# Patient Record
Sex: Female | Born: 1970 | Race: White | Hispanic: No | Smoking: Current every day smoker
Health system: Southern US, Community
[De-identification: ages and names within clinical notes are randomized; demographics above are authoritative.]

## PROBLEM LIST (undated history)

## (undated) DIAGNOSIS — F419 Anxiety disorder, unspecified: Secondary | ICD-10-CM

## (undated) DIAGNOSIS — M1990 Unspecified osteoarthritis, unspecified site: Secondary | ICD-10-CM

## (undated) DIAGNOSIS — I10 Essential (primary) hypertension: Secondary | ICD-10-CM

## (undated) DIAGNOSIS — F32A Depression, unspecified: Secondary | ICD-10-CM

## (undated) DIAGNOSIS — J45909 Unspecified asthma, uncomplicated: Secondary | ICD-10-CM

## (undated) DIAGNOSIS — D649 Anemia, unspecified: Secondary | ICD-10-CM

## (undated) HISTORY — PX: CERVICAL DISCECTOMY: SHX98

---

## 2001-12-03 ENCOUNTER — Emergency Department (HOSPITAL_COMMUNITY): Admission: EM | Admit: 2001-12-03 | Discharge: 2001-12-03 | Payer: Self-pay | Admitting: Emergency Medicine

## 2003-09-21 ENCOUNTER — Emergency Department (HOSPITAL_COMMUNITY): Admission: EM | Admit: 2003-09-21 | Discharge: 2003-09-21 | Payer: Self-pay | Admitting: Emergency Medicine

## 2005-05-05 ENCOUNTER — Ambulatory Visit (HOSPITAL_COMMUNITY): Admission: RE | Admit: 2005-05-05 | Discharge: 2005-05-05 | Payer: Self-pay | Admitting: Neurological Surgery

## 2005-07-21 ENCOUNTER — Encounter: Admission: RE | Admit: 2005-07-21 | Discharge: 2005-07-21 | Payer: Self-pay | Admitting: Neurological Surgery

## 2016-08-06 ENCOUNTER — Emergency Department (HOSPITAL_COMMUNITY)
Admission: EM | Admit: 2016-08-06 | Discharge: 2016-08-06 | Disposition: A | Discharge status: Home / Self Care | Payer: Medicaid Other | Attending: Emergency Medicine | Admitting: Emergency Medicine

## 2016-08-06 ENCOUNTER — Encounter (HOSPITAL_COMMUNITY): Payer: Self-pay

## 2016-08-06 DIAGNOSIS — J45909 Unspecified asthma, uncomplicated: Secondary | ICD-10-CM | POA: Insufficient documentation

## 2016-08-06 DIAGNOSIS — F17210 Nicotine dependence, cigarettes, uncomplicated: Secondary | ICD-10-CM | POA: Insufficient documentation

## 2016-08-06 DIAGNOSIS — J209 Acute bronchitis, unspecified: Secondary | ICD-10-CM

## 2016-08-06 DIAGNOSIS — R05 Cough: Secondary | ICD-10-CM | POA: Diagnosis present

## 2016-08-06 DIAGNOSIS — Z720 Tobacco use: Secondary | ICD-10-CM

## 2016-08-06 HISTORY — DX: Unspecified asthma, uncomplicated: J45.909

## 2016-08-06 MED ORDER — BENZONATATE 100 MG PO CAPS
200.0000 mg | ORAL_CAPSULE | Freq: Once | ORAL | Status: AC
  Administered 2016-08-06: 200 mg via ORAL
  Filled 2016-08-06: qty 2

## 2016-08-06 MED ORDER — AZITHROMYCIN 250 MG PO TABS: ORAL_TABLET | ORAL | 0 refills | Status: DC

## 2016-08-06 MED ORDER — BENZONATATE 100 MG PO CAPS: 200.0000 mg | ORAL_CAPSULE | Freq: Three times a day (TID) | ORAL | 0 refills | Status: DC | PRN

## 2016-08-06 NOTE — ED Notes (Signed)
Pt alert & oriented x4, stable gait. Patient given discharge instructions, paperwork & prescription(s). Patient  instructed to stop at the registration desk to finish any additional paperwork. Patient verbalized understanding. Pt left department w/ no further questions. 

## 2016-08-06 NOTE — ED Triage Notes (Signed)
Cough, congestion X1 week. Weakness, headache with nausea and vomiting. Patient drinking soda in triage without emesis

## 2016-08-06 NOTE — ED Notes (Signed)
Pt states cough for the past 3 days. Pt says had a low grade fever at 100 point something. Pt taking musinex w/o relief.

## 2016-08-06 NOTE — Discharge Instructions (Signed)
Get plenty of rest and drink a lot of fluids.  Try to stop smoking.

## 2016-08-06 NOTE — ED Provider Notes (Signed)
[facility] Provider Note   CSN: [id number] Arrival date & time: 08/06/16  1910     History   Chief Complaint Chief Complaint  Patient presents with  . Cough  . Nasal Congestion    HPI Natalie Acevedo is a 46 y.o. female.  Resents for evaluation of nonproductive cough with low-grade fever, unresponsive to multiple over-the-counter medications.  She denies nausea, vomiting, weakness or dizziness.  She is a smoker.  Similar problem 3 months ago when she was treated with a Z-Pak for a "upper respiratory infection".  She is a smoker.  There are no other known modifying factors.  HPI  Past Medical History:  Diagnosis Date  . Asthma     There are no active problems to display for this patient.   Past Surgical History:  Procedure Laterality Date  . CERVICAL DISCECTOMY    . CESAREAN SECTION     X4    OB History    No data available       Home Medications    Prior to Admission medications   Medication Sig Start Date End Date Taking? Authorizing Provider  azithromycin (ZITHROMAX Z-PAK) 250 MG tablet 2 po day one, then 1 daily x 4 days 08/06/16   Mancel Bale, MD  benzonatate (TESSALON) 100 MG capsule Take 2 capsules (200 mg total) by mouth 3 (three) times daily as needed for cough. 08/06/16   Mancel Bale, MD    Family History No family history on file.  Social History Social History  Substance Use Topics  . Smoking status: Current Every Day Smoker    Packs/day: 1.00    Types: Cigarettes  . Smokeless tobacco: Never Used  . Alcohol use Yes     Comment: occ.     Allergies   Patient has no known allergies.   Review of Systems Review of Systems  All other systems reviewed and are negative.    Physical Exam Updated Vital Signs BP (!) 149/92 (BP Location: Right Arm)   Pulse 95   Temp 98.9 F (37.2 C) (Oral)   Resp 18   Ht 5' 2.5" (1.588 m)   Wt 210 lb (95.3 kg)   LMP 07/06/2016   SpO2 97%   BMI 37.80 kg/m   Physical Exam    Constitutional: She is oriented to person, place, and time. She appears well-developed and well-nourished. No distress.  HENT:  Head: Normocephalic and atraumatic.  Eyes: Conjunctivae and EOM are normal. Pupils are equal, round, and reactive to light.  Neck: Normal range of motion and phonation normal. Neck supple.  Cardiovascular: Normal rate and regular rhythm.   Pulmonary/Chest: Effort normal and breath sounds normal. No respiratory distress. She has no wheezes. She has no rales. She exhibits no tenderness.  Occasional fits of cough, which resolve after 10 seconds.  Lungs with good air movement bilaterally.  No increased work of breathing.  Abdominal: Soft. She exhibits no distension. There is no tenderness. There is no guarding.  Musculoskeletal: Normal range of motion.  Neurological: She is alert and oriented to person, place, and time. She exhibits normal muscle tone.  Skin: Skin is warm and dry.  Psychiatric: She has a normal mood and affect. Her behavior is normal. Judgment and thought content normal.  Nursing note and vitals reviewed.    ED Treatments / Results  Labs (all labs ordered are listed, but only abnormal results are displayed) Labs Reviewed - No data to display  EKG  EKG Interpretation None  Radiology No results found.  Procedures Procedures (including critical care time)  Medications Ordered in ED Medications  benzonatate (TESSALON) capsule 200 mg (200 mg Oral Given 08/06/16 2013)     Initial Impression / Assessment and Plan / ED Course  I have reviewed the triage vital signs and the nursing notes.  Pertinent labs & imaging results that were available during my care of the patient were reviewed by me and considered in my medical decision making (see chart for details).     Medications  benzonatate (TESSALON) capsule 200 mg (200 mg Oral Given 08/06/16 2013)    Patient Vitals for the past 24 hrs:  BP Temp Temp src Pulse Resp SpO2 Height  Weight  08/06/16 1920 - - - - - - 5' 2.5" (1.588 m) 210 lb (95.3 kg)  08/06/16 1919 (!) 149/92 98.9 F (37.2 C) Oral 95 18 97 % - -    9:05 PM Reevaluation with update and discussion. After initial assessment and treatment, an updated evaluation reveals she states her cough is less now, and she is "ready to go."  Findings discussed with patient and all questions answered. [name]    Final Clinical Impressions(s) / ED Diagnoses   Final diagnoses:  Acute bronchitis, unspecified organism  Tobacco abuse   Evaluation is consistent with bronchitis related to tobacco abuse, with irritant cough.  She requested Z-Pak and it is given.  Doubt serious bacterial infection, pneumonia or impending vascular collapse.    Nursing Notes Reviewed/ Care Coordinated Applicable Imaging Reviewed Interpretation of Laboratory Data incorporated into ED treatment  The patient appears reasonably screened and/or stabilized for discharge and I doubt any other medical condition or other [facility] requiring further screening, evaluation, or treatment in the ED at this time prior to discharge.  Plan: Home Medications-continue usual; Home Treatments-rest, fluids, stop smoking; return here if the recommended treatment, does not improve the symptoms; Recommended follow up-PCP, as needed  New Prescriptions New Prescriptions   AZITHROMYCIN (ZITHROMAX Z-PAK) 250 MG TABLET    2 po day one, then 1 daily x 4 days   BENZONATATE (TESSALON) 100 MG CAPSULE    Take 2 capsules (200 mg total) by mouth 3 (three) times daily as needed for cough.     Mancel Bale, MD 08/06/16 2108

## 2016-09-09 ENCOUNTER — Encounter: Payer: Self-pay | Admitting: Family

## 2016-09-09 ENCOUNTER — Encounter (HOSPITAL_COMMUNITY): Payer: Self-pay | Admitting: Emergency Medicine

## 2016-09-09 ENCOUNTER — Ambulatory Visit (INDEPENDENT_AMBULATORY_CARE_PROVIDER_SITE_OTHER): Payer: Worker's Compensation | Admitting: Family

## 2016-09-09 ENCOUNTER — Ambulatory Visit (INDEPENDENT_AMBULATORY_CARE_PROVIDER_SITE_OTHER): Payer: Worker's Compensation

## 2016-09-09 ENCOUNTER — Emergency Department (HOSPITAL_COMMUNITY)
Admission: EM | Admit: 2016-09-09 | Discharge: 2016-09-09 | Disposition: A | Discharge status: Home / Self Care | Payer: Worker's Compensation | Attending: Emergency Medicine | Admitting: Emergency Medicine

## 2016-09-09 ENCOUNTER — Emergency Department (HOSPITAL_COMMUNITY): Payer: Worker's Compensation

## 2016-09-09 VITALS — BP 139/88 | HR 65 | Temp 97.2°F | Ht 62.5 in | Wt 200.2 lb

## 2016-09-09 DIAGNOSIS — IMO0001 Reserved for inherently not codable concepts without codable children: Secondary | ICD-10-CM

## 2016-09-09 DIAGNOSIS — Z23 Encounter for immunization: Secondary | ICD-10-CM | POA: Insufficient documentation

## 2016-09-09 DIAGNOSIS — Y929 Unspecified place or not applicable: Secondary | ICD-10-CM | POA: Diagnosis not present

## 2016-09-09 DIAGNOSIS — Y990 Civilian activity done for income or pay: Secondary | ICD-10-CM | POA: Insufficient documentation

## 2016-09-09 DIAGNOSIS — S6991XA Unspecified injury of right wrist, hand and finger(s), initial encounter: Secondary | ICD-10-CM

## 2016-09-09 DIAGNOSIS — W268XXA Contact with other sharp object(s), not elsewhere classified, initial encounter: Secondary | ICD-10-CM | POA: Diagnosis not present

## 2016-09-09 DIAGNOSIS — S62306B Unspecified fracture of fifth metacarpal bone, right hand, initial encounter for open fracture: Secondary | ICD-10-CM

## 2016-09-09 DIAGNOSIS — Y9389 Activity, other specified: Secondary | ICD-10-CM | POA: Insufficient documentation

## 2016-09-09 DIAGNOSIS — S62636B Displaced fracture of distal phalanx of right little finger, initial encounter for open fracture: Secondary | ICD-10-CM

## 2016-09-09 DIAGNOSIS — S6710XA Crushing injury of unspecified finger(s), initial encounter: Secondary | ICD-10-CM | POA: Diagnosis not present

## 2016-09-09 MED ORDER — AMOXICILLIN-POT CLAVULANATE 875-125 MG PO TABS: 1.0000 | ORAL_TABLET | Freq: Two times a day (BID) | ORAL | 0 refills | Status: DC

## 2016-09-09 MED ORDER — ONDANSETRON HCL 4 MG PO TABS
4.0000 mg | ORAL_TABLET | Freq: Once | ORAL | Status: AC
  Administered 2016-09-09: 4 mg via ORAL
  Filled 2016-09-09: qty 1

## 2016-09-09 MED ORDER — AMOXICILLIN-POT CLAVULANATE 875-125 MG PO TABS
1.0000 | ORAL_TABLET | Freq: Once | ORAL | Status: AC
  Administered 2016-09-09: 1 via ORAL
  Filled 2016-09-09: qty 1

## 2016-09-09 MED ORDER — LIDOCAINE HCL (PF) 2 % IJ SOLN
INTRAMUSCULAR | Status: AC
Start: 2016-09-09 — End: 2016-09-09
  Administered 2016-09-09: 6 mL
  Filled 2016-09-09: qty 10

## 2016-09-09 MED ORDER — LIDOCAINE HCL (PF) 2 % IJ SOLN
10.0000 mL | Freq: Once | INTRAMUSCULAR | Status: AC
  Administered 2016-09-09: 6 mL

## 2016-09-09 MED ORDER — OXYCODONE-ACETAMINOPHEN 5-325 MG PO TABS: 1.0000 | ORAL_TABLET | Freq: Four times a day (QID) | ORAL | 0 refills | Status: DC | PRN

## 2016-09-09 MED ORDER — TETANUS-DIPHTH-ACELL PERTUSSIS 5-2.5-18.5 LF-MCG/0.5 IM SUSP
0.5000 mL | Freq: Once | INTRAMUSCULAR | Status: AC
  Administered 2016-09-09: 0.5 mL via INTRAMUSCULAR
  Filled 2016-09-09: qty 0.5

## 2016-09-09 NOTE — Discharge Instructions (Signed)
You have an open fracture of your right little finger. Please keep your hand elevated above your heart is much as possible. Keep the wound clean and dry. Use Tylenol or ibuprofen for mild pain, use Percocet for more severe pain. Percocet may cause drowsiness, and/or constipation. Please use this medication with caution. Please use Augmentin 2 times daily with a meal. Call Dr. Romeo AppleHarrison for an appointment concerning your hand as soon as possible.

## 2016-09-09 NOTE — ED Provider Notes (Signed)
[facility] Provider Note   CSN: [id number] Arrival date & time: 09/09/16  1331     History   Chief Complaint Chief Complaint  Patient presents with  . Laceration    HPI Natalie Acevedo is a 46 y.o. female.  Patient is a 46 year old female who presents to the emergency department with the complaint of laceration to the right little finger.  The patient states that while at work a palate ran over her finger. She sustained a laceration to the tip of the finger. She presented to the [facility]. They evaluated her, x-ray her and advised her to come to the emergency department for additional evaluation and management. The patient is unsure of the date of her last tetanus. She has not been on any anticoagulation medications recently. There's been no previous operations or procedures involving her right hand on. No other injuries have been reported other than the little finger.   The history is provided by the patient.  Laceration      Past Medical History:  Diagnosis Date  . Asthma     There are no active problems to display for this patient.   Past Surgical History:  Procedure Laterality Date  . CERVICAL DISCECTOMY    . CESAREAN SECTION     X4    OB History    No data available       Home Medications    Prior to Admission medications   Medication Sig Start Date End Date Taking? Authorizing Provider  amoxicillin-clavulanate (AUGMENTIN) 875-125 MG tablet Take 1 tablet by mouth every 12 (twelve) hours. 09/09/16   Ivery QualeBryant, Novalynn Branaman, PA-C  gabapentin (NEURONTIN) 800 MG tablet  07/11/16   [provider]  HYDROcodone-acetaminophen [facility](NORCO) 7.5-325 MG tablet  07/11/16   [provider]  meloxicam (MOBIC) 15 MG tablet  07/11/16   [provider]  oxyCODONE-acetaminophen (PERCOCET/ROXICET) 5-325 MG tablet Take 1 tablet by mouth every 6 (six) hours as needed. 09/09/16   Ivery QualeBryant, Donovin Kraemer, PA-C  tiZANidine (ZANAFLEX) 4  MG tablet  07/11/16   [provider]    Family History History reviewed. No pertinent family history.  Social History Social History  Substance Use Topics  . Smoking status: Current Every Day Smoker    Packs/day: 1.00    Types: Cigarettes  . Smokeless tobacco: Never Used  . Alcohol use Yes     Comment: occ.     Allergies   Patient has no known allergies.   Review of Systems Review of Systems  Constitutional: Negative for activity change.       All ROS Neg except as noted in HPI  HENT: Negative for nosebleeds.   Eyes: Negative for photophobia and discharge.  Respiratory: Negative for cough, shortness of breath and wheezing.   Cardiovascular: Negative for chest pain and palpitations.  Gastrointestinal: Negative for abdominal pain and blood in stool.  Genitourinary: Negative for dysuria, frequency and hematuria.  Musculoskeletal: Negative for arthralgias, back pain and neck pain.  Skin: Negative.   Neurological: Negative for dizziness, seizures and speech difficulty.  Psychiatric/Behavioral: Negative for confusion and hallucinations.     Physical Exam Updated Vital Signs BP (!) 163/86 (BP Location: Left Arm)   Pulse 73   Temp 97.7 F (36.5 C) (Oral)   Resp 18   LMP 08/06/2016   SpO2 95%   Physical Exam  Constitutional: She is oriented to person, place, and time. She appears well-developed and well-nourished.  Non-toxic appearance.  HENT:  Head: Normocephalic.  Right Ear: Tympanic membrane and external ear normal.  Left Ear: Tympanic membrane and external ear normal.  Eyes: EOM and lids are normal. Pupils are equal, round, and reactive to light.  Neck: Normal range of motion. Neck supple. Carotid bruit is not present.  Cardiovascular: Normal rate, regular rhythm, normal heart sounds, intact distal pulses and normal pulses.   Pulmonary/Chest: Breath sounds normal. No respiratory distress.  Abdominal: Soft. Bowel sounds are normal. There is no tenderness.  There is no guarding.  Musculoskeletal: She exhibits tenderness and deformity.  There is full range of motion of the right shoulder, elbow, and wrists. There is full range of motion of the fingers of the right hand. There is avulsion and laceration of the distal portion of the little finger. There is exposed bone at the distal tip. There is a laceration to the medial and lateral aspect of the finger. The nail is almost completely avulsed.  Lymphadenopathy:       Head (right side): No submandibular adenopathy present.       Head (left side): No submandibular adenopathy present.    She has no cervical adenopathy.  Neurological: She is alert and oriented to person, place, and time. She has normal strength. No cranial nerve deficit or sensory deficit.  There no gross motor or sensory deficits of the upper extremities.  Skin: Skin is warm and dry.  Psychiatric: She has a normal mood and affect. Her speech is normal.  Nursing note and vitals reviewed.    ED Treatments / Results  Labs (all labs ordered are listed, but only abnormal results are displayed) Labs Reviewed - No data to display  EKG  EKG Interpretation None       Radiology Dg Finger Little Right  Result Date: 09/09/2016 CLINICAL DATA:  Crush injury EXAM: RIGHT LITTLE FINGER 2+V COMPARISON:  None. FINDINGS: Comminuted fracture of the tuft of the fifth distal phalanx. Mild displacement of the tuft. Dorsal soft tissue injury. IMPRESSION: Comminuted and displaced fracture of the tuft of the distal fifth phalanx. Electronically Signed   By: Marlan Palau M.D.   On: 09/09/2016 13:30    Procedures .Marland KitchenLaceration Repair Date/Time: 09/09/2016 3:59 PM Performed by: Ivery Quale Authorized by: Ivery Quale   Consent:    Consent obtained:  Verbal   Consent given by:  Patient   Risks discussed:  Infection, pain and poor cosmetic result Anesthesia (see MAR for exact dosages):    Anesthesia method:  Nerve block   Block needle  gauge:  25 G   Block anesthetic:  Lidocaine 2% w/o epi Laceration details:    Location:  Finger   Finger location:  R small finger   Length (cm):  2.7 Repair type:    Repair type:  Complex Pre-procedure details:    Preparation:  Patient was prepped and draped in usual sterile fashion Exploration:    Hemostasis achieved with:  Direct pressure   Wound exploration: entire depth of wound probed and visualized     Wound extent: underlying fracture     Wound extent: no nerve damage noted and no tendon damage noted   Treatment:    Area cleansed with:  Betadine and saline   Amount of cleaning:  Standard   Irrigation solution:  Sterile saline Skin repair:    Repair method:  Sutures   Suture size:  4-0   Suture material:  Nylon   Suture technique:  Simple interrupted   Number of sutures:  6 Post-procedure details:  Dressing:  Non-adherent dressing and splint for protection   Patient tolerance of procedure:  Tolerated well, no immediate complications  .Splint Application Date/Time: 09/10/2016 8:58 AM Performed by: Ivery Quale Authorized by: Ivery Quale   Consent:    Consent obtained:  Verbal   Consent given by:  Patient   Risks discussed:  Pain Universal protocol:    Procedure explained and questions answered to patient or proxy's satisfaction: yes     Imaging studies available: yes (Comminuted fracture distal right little finger)     Immediately prior to procedure a time out was called: yes     Patient identity confirmed:  Arm band Pre-procedure details:    Sensation:  Normal Procedure details:    Laterality:  Right   Location:  Finger   Finger:  R small finger   Splint type:  Finger   Supplies:  Aluminum splint Post-procedure details:    Pain:  Unchanged   Sensation:  Normal   Skin color:  Normal   Patient tolerance of procedure:  Tolerated well, no immediate complications   (including critical care time)  Medications Ordered in ED Medications  lidocaine  (XYLOCAINE) 2 % injection 10 mL (6 mLs Other Given by Other 09/09/16 1505)  amoxicillin-clavulanate (AUGMENTIN) 875-125 MG per tablet 1 tablet (1 tablet Oral Given 09/09/16 1557)  ondansetron (ZOFRAN) tablet 4 mg (4 mg Oral Given 09/09/16 1557)  Tdap (BOOSTRIX) injection 0.5 mL (0.5 mLs Intramuscular Given 09/09/16 1557)     Initial Impression / Assessment and Plan / ED Course  I have reviewed the triage vital signs and the nursing notes.  Pertinent labs & imaging results that were available during my care of the patient were reviewed by me and considered in my medical decision making (see chart for details).       Final Clinical Impressions(s) / ED Diagnoses MDM Patient sustained injury to the right little finger when a pallet ran over her finger. She has avulsion of tissue, avulsion of nail, comminuted fracture, open fracture. The lacerations were repaired with sutures. A finger splint was placed. The patient was provided with ice pack. She's also provided with antibiotics since she has an open fracture. Pain medication was offered, but the patient states she has pain medication at home. Patient's tetanus status was updated. She will see Dr. Romeo Apple for orthopedic management at this point.    Final diagnoses:  Cut  Open displaced fracture of distal phalanx of right little finger, initial encounter    New Prescriptions New Prescriptions   AMOXICILLIN-CLAVULANATE (AUGMENTIN) 875-125 MG TABLET    Take 1 tablet by mouth every 12 (twelve) hours.   OXYCODONE-ACETAMINOPHEN (PERCOCET/ROXICET) 5-325 MG TABLET    Take 1 tablet by mouth every 6 (six) hours as needed.     Ivery Quale, PA-C 09/10/16 1610    Blane Ohara, MD 09/12/16 [phone]

## 2016-09-09 NOTE — Patient Instructions (Addendum)
Crush Injury of the Hand  When a crush injury of the hand occurs, many structures within the hand and wrist joint can be affected. This can result in a complicated injury that may involve:   One or more broken (fractured) bones.   Lacerations or abrasions of the skin. These increase your risk of infection.   Compressed or torn muscles.   Torn ligaments and tendons.   Broken blood vessels, causing bleeding within the tissues. This can lead to dangerously high pressure within the tissues (compartment syndrome).   Damage to nerves.   One or more finger amputations.    What are the causes?  This type of injury can happen when a great amount of force is suddenly applied to the hand. This might occur:   During a motor vehicle accident.   If the hand is pulled into a machine during industrial or agricultural work.   If a heavy load falls directly onto the hand.    What are the signs or symptoms?  Symptoms will vary depending on which structures in your hand have been injured. Symptoms may include:   Moderate or severe pain in the hand, wrist, or arm.   Bleeding at the site of injury.   Tingling, numbness, or loss of feeling (sensation) in part or all of your hand.   Loss of movement in part or all of your hand.    How is this diagnosed?  Your health care provider will examine you and ask questions about how your injury happened. The exam may include checking for sensation and blood flow into your hand. You may also have tests, including X-rays and procedures to check the pressure in your hand.  After initial treatment, additional studies may be done to further diagnose the extent of your injuries. These may include:   A nerve conduction study to determine how well the nerves are working in your arm and hand.   MRI to determine if other injuries occurred that do not usually show up on X-ray.    How is this treated?  Treatment for this condition depends on the severity of your crush injury. Treatment may  include:   Thorough cleaning if you have an open wound. This may or may not require surgery.   Having a splint applied to your fingers, hand, or forearm.   Medicine to relieve pain.   Antibiotic medicine to prevent infection.   Stitches (sutures) to close open wounds.   One or more surgeries to address injuries to skin, bones, joints, tendons, ligaments, muscles, nerves, or blood vessels.    Follow these instructions at home:  If you have a splint:    Wear the splint as told by your health care provider. Remove it only as told by your health care provider.   Loosen the splint if your fingers tingle, become numb, or turn cold and blue.   Do not let your splint get wet if it is not waterproof.   Keep the splint clean.  Wound care      If you have any skin wounds that were covered with bandages (dressings), follow instructions from your health care provider about how to take care of your wound. Make sure you:  ? Wash your hands with soap and water before you change your dressing. If soap and water are not available, use hand sanitizer.  ? Change your dressing as told by your health care provider.  ? Leave stitches (sutures), skin glue, or adhesive strips in place.   These skin closures may need to stay in place for 2 weeks or longer. If adhesive strip edges start to loosen and curl up, you may trim the loose edges. Do not remove adhesive strips completely unless your health care provider tells you to do that.   If you have skin wounds, check them every day for signs of infection. Check for:  ? More redness, swelling, or pain.  ? More fluid or blood.  ? Warmth.  ? Pus or a bad smell.  Managing pain, stiffness, and swelling    If directed, put ice on the injured area.  ? Put ice in a plastic bag.  ? Place a towel between your skin and the bag.  ? Leave the ice on for 20 minutes, 2-3 times a day.   Raise (elevate) the injured area above the level of your heart while you are sitting or lying down.  Driving     Do not drive or operate heavy machinery while taking prescription pain medicine.   Ask your health care provider when it is safe to drive if you have a splint on your hand or arm.  Activity    Return to your normal activities as told by your health care provider. Ask your health care provider what activities are safe for you.   Work with a physical therapist (PT) or occupational therapist (OT) as told by your health care provider.  General instructions    Do not put pressure on any part of the splint until it is fully hardened. This may take several hours.   If you have a splint and it is not waterproof, cover it with a watertight plastic bag when you take a bath or a shower.   Take over-the-counter and prescription medicines only as told by your health care provider.   If you were prescribed an antibiotic, take it as told by your health care provider. Do not stop taking the antibiotic before the prescription is done.   Do not use any tobacco products, such as cigarettes, chewing tobacco, and e-cigarettes. Tobacco can delay bone healing. If you need help quitting, ask your health care provider.   Keep all follow-up visits as told by your health care provider. This is important. These include PT and OT visits.  Contact a health care provider if:   A wound that was sutured opens up.   You have more redness, swelling, or pain in your hand.   You have more fluid or blood coming from your hand.   Your hand feels warm to the touch.   You have pus or a bad smell coming from your hand.   You have a fever.  Get help right away if:   You suddenly develop severe pain in your hand.   You previously had sensation in your hand and you suddenly lose sensation.   Your wrist or hand becomes bent (contracted) involuntarily.   Your symptoms had improved and they suddenly get worse.   Your hand or fingers are turning pink or blue.  This information is not intended to replace advice given to you by your health  care provider. Make sure you discuss any questions you have with your health care provider.  Document Released: 03/23/2015 Document Revised: 09/17/2015 Document Reviewed: 12/03/2014  [employer] Interactive Patient Education  2017 [employer]

## 2016-09-09 NOTE — ED Triage Notes (Signed)
Pt cut right pinky finger at work. Half of tip of finger handing. Bleeding controlled. Nail bed not present. Pt denies pain

## 2016-09-09 NOTE — ED Notes (Addendum)
Pt advised us that she was sent by [facility] and had xrays there . Pa aware and hold another xray at this time.

## 2016-09-09 NOTE — Progress Notes (Signed)
   Subjective:    Patient ID: Natalie Acevedo, female    DOB: 03-25-1971, 46 y.o.   MRN: [id number]  HPI Pt presents to the office today for right hand pain that is a Workers Comp Injury at [employer]. Pt states today while working she was reaching for a cord and a pallet ran over her upper small finger. Pt states she is not having any pain at this time.    Review of Systems  Skin: Positive for wound.  All other systems reviewed and are negative.      Objective:   Physical Exam  Constitutional: She is oriented to person, place, and time. She appears well-developed and well-nourished. No distress.  Neck: No thyromegaly present.  Cardiovascular: Normal rate, regular rhythm, normal heart sounds and intact distal pulses.   No murmur heard. Pulmonary/Chest: Effort normal and breath sounds normal. No respiratory distress. She has no wheezes.  Musculoskeletal: Normal range of motion. She exhibits tenderness. She exhibits no edema.  Neurological: She is alert and oriented to person, place, and time.  Skin: Skin is warm and dry.  Crushing injury to right fifth digit to the distal phalanx, opened  Psychiatric: She has a normal mood and affect. Her behavior is normal. Judgment and thought content normal.  Vitals reviewed.       BP 139/88   Pulse 65   Temp 97.2 F (36.2 C) (Oral)   Ht 5' 2.5" (1.588 m)   Wt 200 lb 3.2 oz (90.8 kg)   BMI 36.03 kg/m      Assessment & Plan:  1. Injury of finger of right hand, initial encounter - DG Finger Little Right; Future  2. Crushing injury of finger, initial encounter  3. Open nondisplaced fracture of fifth metacarpal bone of right hand, unspecified portion of metacarpal, initial encounter    Pt told to go to ED Keep clean and dry Do not pick at wound!!!!   Jannifer Rodneyhristy Cruzita Lipa, FNP

## 2016-09-23 ENCOUNTER — Telehealth: Payer: Self-pay | Admitting: Orthopedic Surgery

## 2016-09-23 NOTE — Telephone Encounter (Signed)
We ll see when we get W/C info

## 2016-09-23 NOTE — Telephone Encounter (Signed)
ER visit 09/09/16 for Open distal fracture of right little finger has been determined as Workers Comp. We have been Workers Designer, industrial/productComp adjuster requesting appointment, please review and advise to schedule here or possible Hydrographic surveyorhand surgeon.

## 2016-09-26 NOTE — Telephone Encounter (Signed)
Contacted patient upon receiving [employer] information regarding workers comp - received claim# [id number], per [employer] Corporationadjuster Christy Wilson, [phone], fax#[phone].  Appointment has been scheduled, as all billing information has been received. Patient aware of appointment (scheduled for date patient requests, Friday, 09/30/16, 11:30a.m.)

## 2016-09-30 ENCOUNTER — Encounter: Payer: Self-pay | Admitting: Orthopedic Surgery

## 2016-09-30 ENCOUNTER — Ambulatory Visit (INDEPENDENT_AMBULATORY_CARE_PROVIDER_SITE_OTHER): Payer: Worker's Compensation | Admitting: Orthopedic Surgery

## 2016-09-30 VITALS — BP 154/94 | HR 100 | Wt 195.0 lb

## 2016-09-30 DIAGNOSIS — S62636A Displaced fracture of distal phalanx of right little finger, initial encounter for closed fracture: Secondary | ICD-10-CM

## 2016-09-30 NOTE — Progress Notes (Signed)
  NEW PATIENT OFFICE VISIT    Chief Complaint  Patient presents with  . Finger Injury    right little finger fracture S/P crush injury, DOI 09/09/16    46 year old female was at work and her finger was crushed. She went to the emergency room for evaluation of the right small finger was treated and she presents 3 weeks later for mandatory evaluation    Review of Systems  Constitutional: Negative for chills and fever.  Skin: Negative.  Negative for itching and rash.  Neurological: Negative for tingling.     Past Medical History:  Diagnosis Date  . Asthma     Past Surgical History:  Procedure Laterality Date  . CERVICAL DISCECTOMY    . CESAREAN SECTION     X4    No family history on file. Social History  Substance Use Topics  . Smoking status: Current Every Day Smoker    Packs/day: 1.00    Types: Cigarettes  . Smokeless tobacco: Never Used  . Alcohol use Yes     Comment: occ.    BP (!) 154/94   Pulse 100   Wt 195 lb (88.5 kg)   BMI 35.10 kg/m   Physical Exam  Constitutional: She is oriented to person, place, and time. She appears well-developed and well-nourished.  Neurological: She is alert and oriented to person, place, and time.  Skin: Skin is warm and dry. Capillary refill takes less than 2 seconds. No rash noted.  Psychiatric: She has a normal mood and affect. Her behavior is normal. Judgment and thought content normal.    Ortho Exam The right index finger has a scab over the pulp the nail is proximal to this area there is some motion there from the fracture. There is no instability of the joint is full range of motion normal sensation normal capillary refill and no sign of infection No orders of the defined types were placed in this encounter.   Encounter Diagnosis  Name Primary?  . Closed displaced fracture of distal phalanx of right little finger, initial encounter Yes     PLAN:   The patient can resume normal activity there is no long-term  issue that I can see  She can return to work full duty no follow-up needed

## 2016-09-30 NOTE — Patient Instructions (Signed)
Resume normal activity including full duty

## 2018-02-28 DIAGNOSIS — I10 Essential (primary) hypertension: Secondary | ICD-10-CM | POA: Insufficient documentation

## 2018-02-28 DIAGNOSIS — E669 Obesity, unspecified: Secondary | ICD-10-CM | POA: Insufficient documentation

## 2018-02-28 DIAGNOSIS — R252 Cramp and spasm: Secondary | ICD-10-CM | POA: Insufficient documentation

## 2018-10-02 ENCOUNTER — Encounter: Payer: Self-pay | Admitting: Neurology

## 2018-10-02 NOTE — Progress Notes (Signed)
Virtual Visit via Video Note The purpose of this virtual visit is to provide medical care while limiting exposure to the novel coronavirus.    Consent was obtained for video visit:  Yes Answered questions that patient had about telehealth interaction:  Yes I discussed the limitations, risks, security and privacy concerns of performing an evaluation and management service by telemedicine. I also discussed with the patient that there may be a patient responsible charge related to this service. The patient expressed understanding and agreed to proceed.  Pt location: Home Physician Location: Home Name of referring provider:  Sherryl MangesMeyran, Natalie Acevedo* I connected with Natalie Acevedo at patients initiation/request on 10/03/2018 at 12:50 PM EDT by video enabled telemedicine application and verified that I am speaking with the correct person using two identifiers. Pt MRN:  [id number] Pt DOB:  August 21, 1970 Video Participants:  Natalie Acevedo   History of Present Illness:  Natalie Acevedo is a 48 year old woman who presents for migraines.  Onset:  2 months ago.  She reports feeling menopausal.  She is feeling more depressed and irritable.  Many years ago, she had minor daily headaches which responded to acupuncture. Location:  bifrontal Quality:  throbbing Intensity:  10/10.  She denies new headache, thunderclap headache  Aura:  no Premonitory Phase:  no Postdrome:  no Associated symptoms:  Sometimes nausea..  She denies associated vomiting, photophobia, phonophobia, visual disturbance or unilateral numbness or weakness. Duration:  Varies hour to several hours Frequency:  daily Frequency of abortive medication: Excedrin Migraine daily.  Sumatriptan once in past month. Triggers:  Valsalva (coughting, sneezing) Relieving factors:  no Activity:  Aggravates. Sometimes dizziness and staggering.  Some memory issues.  Current NSAIDS:  Mobic Current analgesics:  Excedrin Migraine Current triptans:   Sumatriptan 50mg  Current muscle relaxants:  Tizanidine 4mg  Current Antidepressant medications:  Lexapro 10mg  daily  Past anticonvulsant:  topiramate 50mg    Smoker:  1  ppd Depression:  yes; Anxiety:  yes.  She says she is menopausal.  She is more irritable.  Endorses mood swings Family history of headache:  no  Past Medical History: Past Medical History:  Diagnosis Date  . Asthma     Medications: Outpatient Encounter Medications as of 10/03/2018  Medication Sig  . gabapentin (NEURONTIN) 800 MG tablet   . HYDROcodone-acetaminophen (NORCO) 7.5-325 MG tablet   . meloxicam (MOBIC) 15 MG tablet   . tiZANidine (ZANAFLEX) 4 MG tablet    No facility-administered encounter medications on file as of 10/03/2018.     Allergies: No Known Allergies  Family History: No family history on file.  Social History: Social History   Socioeconomic History  . Marital status: Divorced    Spouse name: Not on file  . Number of children: Not on file  . Years of education: Not on file  . Highest education level: Not on file  Occupational History  . Not on file  Social Needs  . Financial resource strain: Not on file  . Food insecurity:    Worry: Not on file    Inability: Not on file  . Transportation needs:    Medical: Not on file    Non-medical: Not on file  Tobacco Use  . Smoking status: Current Every Day Smoker    Packs/day: 1.00    Types: Cigarettes  . Smokeless tobacco: Never Used  Substance and Sexual Activity  . Alcohol use: Yes    Comment: occ.  . Drug use: No  . Sexual activity: Not on  file  Lifestyle  . Physical activity:    Days per week: Not on file    Minutes per session: Not on file  . Stress: Not on file  Relationships  . Social connections:    Talks on phone: Not on file    Gets together: Not on file    Attends religious service: Not on file    Active member of club or organization: Not on file    Attends meetings of clubs or organizations: Not on file     Relationship status: Not on file  . Intimate partner violence:    Fear of current or ex partner: Not on file    Emotionally abused: Not on file    Physically abused: Not on file    Forced sexual activity: Not on file  Other Topics Concern  . Not on file  Social History Narrative  . Not on file    Observations/Objective:   Vitals:  no acute distress.  Alert and oriented.  Speech fluent.  Face symmetric.  Assessment and Plan:   New-onset daily persistent headaches.  Possible intractable migraine without aura Tobacco use disorder  1. As these are new-onset persistent headaches, will check MRI of brain with and without contrast  2. For preventative management, restart topiramate 50mg  at bedtime and increase to 100mg  at bedtime in one week.  If ineffective, try Aimovig 3.  For abortive therapy, stop sumatriptan and instead try rizatriptan 10mg  4.  Limit use of pain relievers to no more than 2 days out of week to prevent risk of rebound or medication-overuse headache. 5.  Keep headache diary 6.  Exercise, hydration, caffeine cessation, sleep hygiene, monitor for and avoid triggers 7.  Consider:  magnesium citrate 400mg  daily, riboflavin 400mg  daily, and coenzyme Q10 100mg  three times daily 8. Always keep in mind that currently taking a hormone or birth control may be a possible trigger or aggravating factor for migraine.  9.  Tobacco cessation counseling (CPT 99406):  Tobacco use with no history of CAD, stroke, or cancer  - Currently smoking 1 packs/day   - Patient was informed of the dangers of tobacco abuse including stroke, cancer, and MI, as well as benefits of tobacco cessation. - Patient is not willing to quit at this time. - Approximately 5 mins were spent counseling patient cessation techniques. We discussed various methods to help quit smoking, including deciding on a date to quit, joining a support group, pharmacological agents- nicotine gum/patch/lozenges, chantix.  - I will  reassess her progress at the next follow-up visit 10.  Follow up in 4 months   Follow Up Instructions:    -I discussed the assessment and treatment plan with the patient. The patient was provided an opportunity to ask questions and all were answered. The patient agreed with the plan and demonstrated an understanding of the instructions.   The patient was advised to call back or seek an in-person evaluation if the symptoms worsen or if the condition fails to improve as anticipated.    Total Time spent in visit with the patient was:  40 minutes   Dudley Major, DO

## 2018-10-03 ENCOUNTER — Telehealth: Payer: Self-pay

## 2018-10-03 ENCOUNTER — Telehealth (INDEPENDENT_AMBULATORY_CARE_PROVIDER_SITE_OTHER): Payer: Medicaid Other | Admitting: Neurology

## 2018-10-03 ENCOUNTER — Other Ambulatory Visit: Payer: Self-pay

## 2018-10-03 ENCOUNTER — Encounter: Payer: Self-pay | Admitting: Neurology

## 2018-10-03 VITALS — Ht 62.5 in | Wt 215.0 lb

## 2018-10-03 DIAGNOSIS — J4520 Mild intermittent asthma, uncomplicated: Secondary | ICD-10-CM | POA: Insufficient documentation

## 2018-10-03 DIAGNOSIS — G4452 New daily persistent headache (NDPH): Secondary | ICD-10-CM | POA: Diagnosis not present

## 2018-10-03 DIAGNOSIS — G43019 Migraine without aura, intractable, without status migrainosus: Secondary | ICD-10-CM

## 2018-10-03 DIAGNOSIS — F331 Major depressive disorder, recurrent, moderate: Secondary | ICD-10-CM | POA: Insufficient documentation

## 2018-10-03 DIAGNOSIS — F17200 Nicotine dependence, unspecified, uncomplicated: Secondary | ICD-10-CM

## 2018-10-03 MED ORDER — TOPIRAMATE 50 MG PO TABS: ORAL_TABLET | ORAL | 0 refills | Status: DC

## 2018-10-03 MED ORDER — RIZATRIPTAN BENZOATE 10 MG PO TABS: ORAL_TABLET | ORAL | 0 refills | Status: DC

## 2018-10-03 NOTE — Telephone Encounter (Signed)
Called centralized scheduling, spoke with Natalie Acevedo, she will contact Pt to schedule at [facility]

## 2018-10-15 ENCOUNTER — Ambulatory Visit (HOSPITAL_COMMUNITY)
Admission: RE | Admit: 2018-10-15 | Discharge: 2018-10-15 | Disposition: A | Discharge status: Home / Self Care | Payer: Medicaid Other | Source: Ambulatory Visit | Attending: Neurology | Admitting: Neurology

## 2018-10-15 ENCOUNTER — Other Ambulatory Visit: Payer: Self-pay

## 2018-10-15 ENCOUNTER — Telehealth: Payer: Self-pay

## 2018-10-15 DIAGNOSIS — G4452 New daily persistent headache (NDPH): Secondary | ICD-10-CM | POA: Diagnosis not present

## 2018-10-15 LAB — POCT I-STAT CREATININE: Creatinine, Ser: 0.7 mg/dL (ref 0.44–1.00)

## 2018-10-15 MED ORDER — GADOBUTROL 1 MMOL/ML IV SOLN
10.0000 mL | Freq: Once | INTRAVENOUS | Status: AC | PRN
  Administered 2018-10-15: 10:00:00 10 mL via INTRAVENOUS

## 2018-10-15 NOTE — Telephone Encounter (Signed)
-----   Message from Pieter Partridge, DO sent at 10/15/2018  1:59 PM EDT ----- MRI of brain shows nothing concerning

## 2018-10-15 NOTE — Telephone Encounter (Signed)
Informed patient of MRI results.

## 2018-10-30 ENCOUNTER — Other Ambulatory Visit: Payer: Self-pay | Admitting: Neurology

## 2018-11-05 ENCOUNTER — Telehealth: Payer: Self-pay | Admitting: Neurology

## 2018-11-05 NOTE — Telephone Encounter (Signed)
Patient left msg with after hours needing a refill. Did not give med name or pharm.

## 2018-11-05 NOTE — Telephone Encounter (Signed)
Called and advised Pt refills have been sent in

## 2019-01-28 ENCOUNTER — Other Ambulatory Visit: Payer: Self-pay | Admitting: Family Medicine

## 2019-01-31 NOTE — Progress Notes (Deleted)
NEUROLOGY FOLLOW UP OFFICE NOTE  Bergen NOHEMI NICKLAUS [id number]  HISTORY OF PRESENT ILLNESS: Jenesa Vanevery is a 48 year old woman who follows up for migraine.  UPDATE: MRI of brain with and without contrast on 10/15/2018 personally reviewed and was unremarkable  Intensity:  *** Duration:  *** Frequency:  *** Frequency of abortive medication: *** Current NSAIDS:  Mobic Current analgesics:  Excedrin Current triptans:  Rizatriptan 10mg  Current ergotamine:  none Current anti-emetic:  none Current muscle relaxants:  Tizanidine 4mg  Current anti-anxiolytic:  none Current sleep aide:  none Current Antihypertensive medications:  none Current Antidepressant medications:  Lexapro 10mg  daily Current Anticonvulsant medications:  topiramate 100mg  Current anti-CGRP:  none Current Vitamins/Herbal/Supplements:  none Current Antihistamines/Decongestants:  none Other therapy:  none   Smoker:  1  ppd Depression:  yes; Anxiety:  yes.  She says she is menopausal.  She is more irritable.  Endorses mood swings  HISTORY: Onset:  April.  She reports feeling menopausal.  She is feeling more depressed and irritable.  Many years ago, she had minor daily headaches which responded to acupuncture. Location:  bifrontal Quality:  throbbing Initial intensity:  10/10.  She denies new headache, thunderclap headache  Aura:  no Premonitory Phase:  no Postdrome:  no Associated symptoms:  Sometimes nausea..  She denies associated vomiting, photophobia, phonophobia, visual disturbance or unilateral numbness or weakness. Initial Duration:  Varies hour to several hours Initial Frequency:  daily Initial Frequency of abortive medication: Excedrin Migraine daily.  Sumatriptan once in past month. Triggers: Valsalva (coughting, sneezing) Relieving factors:  no Activity:  Aggravates. Sometimes dizziness and staggering.  Some memory issues.  Past triptans:  Sumatriptan 50mg  Past anticonvulsant:  topiramate 50mg     Family history of headache:  no  PAST MEDICAL HISTORY: Past Medical History:  Diagnosis Date  . Asthma     MEDICATIONS: Current Outpatient Medications on File Prior to Visit  Medication Sig Dispense Refill  . gabapentin (NEURONTIN) 800 MG tablet   1  . HYDROcodone-acetaminophen (NORCO) 7.5-325 MG tablet   0  . meloxicam (MOBIC) 15 MG tablet   1  . rizatriptan (MAXALT) 10 MG tablet Take 1 tablet earliest onset of migraine.  May repeat in 2 hours if needed.  Maximum 2 tablets in 24 hours 10 tablet 0  . rizatriptan (MAXALT-MLT) 10 MG disintegrating tablet TAKE 1 TABLET AT ONSET OF MIGRAINE HEADACHE MAY REPEAT ONCE IN 2 HOURS (MAX OF 2 TABLETS/24 HOURS) 9 tablet 3  . tiZANidine (ZANAFLEX) 4 MG tablet   1  . topiramate (TOPAMAX) 50 MG tablet TAKE 1 TAB AT BEDTIME FOR 7 DAYS THEN TAKE 2 TABLETS AT BEDTIME 180 tablet 0   No current facility-administered medications on file prior to visit.     ALLERGIES: Allergies  Allergen Reactions  . Lisinopril Cough    FAMILY HISTORY: Family History  Problem Relation Age of Onset  . Kidney disease Mother   . Cancer Maternal Grandmother     SOCIAL HISTORY: Social History   Socioeconomic History  . Marital status: Divorced    Spouse name: Not on file  . Number of children: Not on file  . Years of education: Not on file  . Highest education level: Not on file  Occupational History  . Not on file  Social Needs  . Financial resource strain: Not on file  . Food insecurity    Worry: Not on file    Inability: Not on file  . Transportation needs    Medical:  Not on file    Non-medical: Not on file  Tobacco Use  . Smoking status: Current Every Day Smoker    Packs/day: 1.00    Types: Cigarettes  . Smokeless tobacco: Never Used  Substance and Sexual Activity  . Alcohol use: Yes    Comment: occ.  . Drug use: No  . Sexual activity: Not on file  Lifestyle  . Physical activity    Days per week: Not on file    Minutes per session:  Not on file  . Stress: Not on file  Relationships  . Social Herbalist on phone: Not on file    Gets together: Not on file    Attends religious service: Not on file    Active member of club or organization: Not on file    Attends meetings of clubs or organizations: Not on file    Relationship status: Not on file  . Intimate partner violence    Fear of current or ex partner: Not on file    Emotionally abused: Not on file    Physically abused: Not on file    Forced sexual activity: Not on file  Other Topics Concern  . Not on file  Social History Narrative   Right handed   Lives in single story home with sister and daughter    REVIEW OF SYSTEMS: Constitutional: No fevers, chills, or sweats, no generalized fatigue, change in appetite Eyes: No visual changes, double vision, eye pain Ear, nose and throat: No hearing loss, ear pain, nasal congestion, sore throat Cardiovascular: No chest pain, palpitations Respiratory:  No shortness of breath at rest or with exertion, wheezes GastrointestinaI: No nausea, vomiting, diarrhea, abdominal pain, fecal incontinence Genitourinary:  No dysuria, urinary retention or frequency Musculoskeletal:  No neck pain, back pain Integumentary: No rash, pruritus, skin lesions Neurological: as above Psychiatric: No depression, insomnia, anxiety Endocrine: No palpitations, fatigue, diaphoresis, mood swings, change in appetite, change in weight, increased thirst Hematologic/Lymphatic:  No purpura, petechiae. Allergic/Immunologic: no itchy/runny eyes, nasal congestion, recent allergic reactions, rashes  PHYSICAL EXAM: *** General: No acute distress.  Patient appears well-groomed.   Head:  Normocephalic/atraumatic Eyes:  Fundi examined but not visualized Neck: supple, no paraspinal tenderness, full range of motion Heart:  Regular rate and rhythm Lungs:  Clear to auscultation bilaterally Back: No paraspinal tenderness Neurological Exam: alert and  oriented to person, place, and time. Attention span and concentration intact, recent and remote memory intact, fund of knowledge intact.  Speech fluent and not dysarthric, language intact.  CN II-XII intact. Bulk and tone normal, muscle strength 5/5 throughout.  Sensation to light touch, temperature and vibration intact.  Deep tendon reflexes 2+ throughout, toes downgoing.  Finger to nose and heel to shin testing intact.  Gait normal, Romberg negative.  IMPRESSION: ***  PLAN: ***  Metta Clines, DO

## 2019-02-04 ENCOUNTER — Ambulatory Visit: Payer: Medicaid Other | Admitting: Neurology

## 2019-02-19 ENCOUNTER — Ambulatory Visit: Payer: Medicaid Other | Admitting: Neurology

## 2019-03-08 ENCOUNTER — Other Ambulatory Visit: Payer: Self-pay | Admitting: Family Medicine

## 2019-04-22 ENCOUNTER — Encounter: Payer: Self-pay | Admitting: Neurology

## 2019-04-23 ENCOUNTER — Telehealth: Payer: Self-pay | Admitting: Neurology

## 2019-04-23 NOTE — Telephone Encounter (Signed)
Called and relayed message to patient. Thank you

## 2019-04-23 NOTE — Telephone Encounter (Signed)
Please let patient know, she will need to get it from her PCP, sorry

## 2019-04-23 NOTE — Telephone Encounter (Signed)
Patient called in needing to see if Dr. Tomi Likens would call in a refill for her Migraine medication. She said that her PCP filled it last and who has prescribed it for her. She said she cannot get through to her PCP. The medication did not seem to be listed on the Med list. She said she has a coupon for a 2 week supply. She uses [facility]. Please Call. Thank you

## 2019-05-07 NOTE — Progress Notes (Signed)
Virtual Visit via Video Note The purpose of this virtual visit is to provide medical care while limiting exposure to the novel coronavirus.    Consent was obtained for video visit:  Yes.   Answered questions that patient had about telehealth interaction:  Yes.   I discussed the limitations, risks, security and privacy concerns of performing an evaluation and management service by telemedicine. I also discussed with the patient that there may be a patient responsible charge related to this service. The patient expressed understanding and agreed to proceed.  Pt location: Home Physician Location: office Name of referring provider:  [facility] I connected with Natalie Acevedo at patients initiation/request on 05/08/2019 at 11:10 AM EST by video enabled telemedicine application and verified that I am speaking with the correct person using two identifiers. Pt MRN:  [id number] Pt DOB:  June 21, 1970 Video Participants:  Natalie Acevedo   History of Present Illness:  Natalie Acevedo is a 49 year old woman who follows up for headache.  UPDATE: MRI of brain with and without contrast from 10/15/2018 was personally reviewed and was unremarkable.  She was prescribed Fioricet by her PCP, which helped.  However, she has since ran out.  She is taking analgesics daily such as Tylenol or Excedrin Migraine Intensity:  Moderate to severe Duration:  constant Frequency:  constant Frequency of abortive medication: daily Current NSAIDS:  Mobic; Fioricet Current analgesics:  Excedrin Migraine; Tylenol Current triptans:  none Current muscle relaxants:  Tizanidine 4mg  Current antihypertensive:  Losartan, HCTZ Current Antidepressant medications:  Lexapro, Wellbutrin Current antiepileptic medication:  none   HISTORY: Onset:  April 2020.  She reports feeling menopausal.  She is feeling more depressed and irritable.  Many years ago, she had minor daily headaches which responded to  acupuncture. Location:  bifrontal Quality:  throbbing Initial tensity:  10/10.  She denies new headache, thunderclap headache  Aura:  no Premonitory Phase:  no Postdrome:  no Associated symptoms:  Sometimes nausea..  She denies associated vomiting, photophobia, phonophobia, visual disturbance or unilateral numbness or weakness. Initial duration:  Varies hour to several hours Initial frequency:  daily Initial frequency of abortive medication: Excedrin Migraine daily.  Sumatriptan once in past month. Triggers:  Valsalva (coughting, sneezing) Relieving factors:  no Activity:  Aggravates. Sometimes dizziness and staggering.  Some memory issues.   Past triptan:  Sumatriptan 50mg ; rizatriptan 10mg  Past anticonvulsant:  topiramate 100mg ; gabapentin 800mg    Smoker:  1  ppd Depression:  yes; Anxiety:  yes.  She says she is menopausal.  She is more irritable.  Endorses mood swings Family history of headache:  no   Past Medical History: Past Medical History:  Diagnosis Date  . Asthma     Medications: Outpatient Encounter Medications as of 05/08/2019  Medication Sig  . gabapentin (NEURONTIN) 800 MG tablet   . HYDROcodone-acetaminophen (NORCO) 7.5-325 MG tablet   . meloxicam (MOBIC) 15 MG tablet   . rizatriptan (MAXALT) 10 MG tablet Take 1 tablet earliest onset of migraine.  May repeat in 2 hours if needed.  Maximum 2 tablets in 24 hours  . rizatriptan (MAXALT-MLT) 10 MG disintegrating tablet TAKE 1 TABLET AT ONSET OF MIGRAINE HEADACHE MAY REPEAT ONCE IN 2 HOURS (MAX OF 2 TABLETS/24 HOURS)  . tiZANidine (ZANAFLEX) 4 MG tablet   . topiramate (TOPAMAX) 50 MG tablet TAKE 1 TAB AT BEDTIME FOR 7 DAYS THEN TAKE 2 TABLETS AT BEDTIME   No facility-administered encounter medications on file as of 05/08/2019.  Allergies: Allergies  Allergen Reactions  . Lisinopril Cough    Family History: Family History  Problem Relation Age of Onset  . Kidney disease Mother   . Cancer Maternal  Grandmother     Social History: Social History   Socioeconomic History  . Marital status: Divorced    Spouse name: Not on file  . Number of children: Not on file  . Years of education: Not on file  . Highest education level: Not on file  Occupational History  . Not on file  Tobacco Use  . Smoking status: Current Every Day Smoker    Packs/day: 1.00    Types: Cigarettes  . Smokeless tobacco: Never Used  Substance and Sexual Activity  . Alcohol use: Yes    Comment: occ.  . Drug use: No  . Sexual activity: Not on file  Other Topics Concern  . Not on file  Social History Narrative   Right handed   Lives in single story home with sister and daughter   Social Determinants of Health   Financial Resource Strain:   . Difficulty of Paying Living Expenses: Not on file  Food Insecurity:   . Worried About Programme researcher, broadcasting/film/video in the Last Year: Not on file  . Ran Out of Food in the Last Year: Not on file  Transportation Needs:   . Lack of Transportation (Medical): Not on file  . Lack of Transportation (Non-Medical): Not on file  Physical Activity:   . Days of Exercise per Week: Not on file  . Minutes of Exercise per Session: Not on file  Stress:   . Feeling of Stress : Not on file  Social Connections:   . Frequency of Communication with Friends and Family: Not on file  . Frequency of Social Gatherings with Friends and Family: Not on file  . Attends Religious Services: Not on file  . Active Member of Clubs or Organizations: Not on file  . Attends Banker Meetings: Not on file  . Marital Status: Not on file  Intimate Partner Violence:   . Fear of Current or Ex-Partner: Not on file  . Emotionally Abused: Not on file  . Physically Abused: Not on file  . Sexually Abused: Not on file    Observations/Objective:   Height 5' 2.5" (1.588 m), weight 227 lb (103 kg). No acute distress.  Alert and oriented.  Speech fluent and not dysarthric.  Language intact.  Eyes  orthophoric on primary gaze.  Face symmetric.  Assessment and Plan:   1.  New daily persistent headache, which I believe are chronic migraines without aura, without status migrainosus, intractable.    I would like to start her on a CGRP inhibitor.  She has failed topiramate.  She cannot take a beta blocker due to asthma.  I do not want to start an antidepressant such as amitriptyline or venlafaxine as she is already on two antidepressants (escitalopram and bupropion).   1.  For preventative management, will start Aimovig 140mg  every 30 days 2.  For abortive therapy, Tylenol or Excedrin. Advised that she must limit use of pain relievers to no more than 2 days out of week to prevent risk of rebound or medication-overuse headache.  If she cannot stop cold , I recommended tapering down by a day every week to 2 days a week.  Advised NOT to take Fioricet as it easily contributes to rebound headache. 3.  Keep headache diary 4.  Exercise, hydration, caffeine cessation, sleep hygiene, monitor  for and avoid triggers.  She is going to look into acupuncture again. 5.  Follow up in 4 months.  Follow Up Instructions:    -I discussed the assessment and treatment plan with the patient. The patient was provided an opportunity to ask questions and all were answered. The patient agreed with the plan and demonstrated an understanding of the instructions.   The patient was advised to call back or seek an in-person evaluation if the symptoms worsen or if the condition fails to improve as anticipated.  Cira Servant, DO

## 2019-05-08 ENCOUNTER — Encounter: Payer: Self-pay | Admitting: Neurology

## 2019-05-08 ENCOUNTER — Telehealth (INDEPENDENT_AMBULATORY_CARE_PROVIDER_SITE_OTHER): Payer: Medicaid Other | Admitting: Neurology

## 2019-05-08 ENCOUNTER — Other Ambulatory Visit: Payer: Self-pay

## 2019-05-08 VITALS — Ht 62.5 in | Wt 227.0 lb

## 2019-05-08 DIAGNOSIS — G43719 Chronic migraine without aura, intractable, without status migrainosus: Secondary | ICD-10-CM | POA: Diagnosis not present

## 2019-05-08 MED ORDER — AIMOVIG 140 MG/ML ~~LOC~~ SOAJ: 140.0000 mg | SUBCUTANEOUS | 11 refills | Status: DC

## 2019-09-16 NOTE — Progress Notes (Signed)
Virtual Visit via Video Note The purpose of this virtual visit is to provide medical care while limiting exposure to the novel coronavirus.    Consent was obtained for video visit:  Yes.   Answered questions that patient had about telehealth interaction:  Yes.   I discussed the limitations, risks, security and privacy concerns of performing an evaluation and management service by telemedicine. I also discussed with the patient that there may be a patient responsible charge related to this service. The patient expressed understanding and agreed to proceed.  Pt location: Home Physician Location: office Name of referring provider:  [facility] I connected with Natalie Acevedo at patients initiation/request on 09/17/2019 at  1:30 PM EDT by video enabled telemedicine application and verified that I am speaking with the correct person using two identifiers. Pt MRN:  [id number] Pt DOB:  [birth year] Video Participants:  Natalie Acevedo;   History of Present Illness:  Natalie Acevedo is a 49 year old woman who follows up for headache.  UPDATE: Started on [place] in January.    In past 30 days, no migraines.  Once a week, may wake up with dull headache that resolves in a couple of hours.   Frequency of abortive medication: daily Current NSAIDS:Mobic; Fioricet Current analgesics:Excedrin Migraine; Tylenol Current triptans:none Current muscle relaxants:Tizanidine 4mg  Current antihypertensive:  Losartan, HCTZ Current Antidepressant medications:Lexapro, Wellbutrin Current antiepileptic medication:  none Current CGRP inhibitor:  Aimovig 140mg    HISTORY: Onset:April 2020. She reports feeling menopausal. She is feeling more depressed and irritable. Many years ago, she had minor daily headaches which responded to acupuncture. Location:bifrontal Quality:throbbing Initial tensity:10/10. Shedenies new headache, thunderclap headache  Aura:no Premonitory  Phase:no Postdrome:no Associated symptoms:Sometimes nausea.Natalie Acevedo associated vomiting, photophobia, phonophobia, visual disturbance orunilateral numbness or weakness. Initial duration:Varies hour to several hours Initial frequency:daily Initial frequency of abortive medication:Excedrin Migraine daily. Sumatriptan once in past month. Triggers:Valsalva (coughting, sneezing) Relieving factors:no Activity:Aggravates. Sometimes dizziness and staggering. Some memory issues.  MRI of brain with and without contrast from 10/15/2018 was unremarkable.   Past triptan:  Sumatriptan 50mg ; rizatriptan 10mg  Pastanticonvulsant: topiramate 100mg ; gabapentin 800mg    Smoker:1 ppd Depression:yes; Anxiety:yes. She says she is menopausal. She is more irritable. Endorses mood swings Family history of headache:no  Past Medical History: Past Medical History:  Diagnosis Date  . Asthma     Medications: Outpatient Encounter Medications as of 09/17/2019  Medication Sig  . albuterol (VENTOLIN HFA) 108 (90 Base) MCG/ACT inhaler USE 2 PUFFS EVERY 6 HOURS AS NEEDED  . buPROPion (WELLBUTRIN SR) 100 MG 12 hr tablet Take by mouth.  . butalbital-acetaminophen-caffeine (FIORICET WITH CODEINE) 50-325-40-30 MG capsule TAKE 1 CAPSULE EVERY 6 HOURS AS NEEDED  . Erenumab-aooe (AIMOVIG) 140 MG/ML SOAJ Inject 140 mg into the skin every 30 (thirty) days.  Marland Kitchen escitalopram (LEXAPRO) 10 MG tablet TAKE ONE (1) TABLET EACH DAY  . gabapentin (NEURONTIN) 800 MG tablet   . hydrochlorothiazide (HYDRODIURIL) 25 MG tablet TAKE ONE (1) TABLET EACH DAY  . HYDROcodone-acetaminophen (NORCO) 7.5-325 MG tablet   . losartan (COZAAR) 50 MG tablet TAKE ONE TABLET BY MOUTH TWICE DAILY  . meloxicam (MOBIC) 15 MG tablet   . rizatriptan (MAXALT) 10 MG tablet Take 1 tablet earliest onset of migraine.  May repeat in 2 hours if needed.  Maximum 2 tablets in 24 hours  . rizatriptan (MAXALT-MLT) 10 MG  disintegrating tablet TAKE 1 TABLET AT ONSET OF MIGRAINE HEADACHE MAY REPEAT ONCE IN 2 HOURS (MAX OF 2 TABLETS/24 HOURS)  .  tiZANidine (ZANAFLEX) 4 MG tablet   . topiramate (TOPAMAX) 50 MG tablet TAKE 1 TAB AT BEDTIME FOR 7 DAYS THEN TAKE 2 TABLETS AT BEDTIME   No facility-administered encounter medications on file as of 09/17/2019.    Allergies: Allergies  Allergen Reactions  . Lisinopril Cough    Family History: Family History  Problem Relation Age of Onset  . Kidney disease Mother   . Cancer Maternal Grandmother     Social History: Social History   Socioeconomic History  . Marital status: Divorced    Spouse name: Not on file  . Number of children: Not on file  . Years of education: Not on file  . Highest education level: Not on file  Occupational History  . Not on file  Tobacco Use  . Smoking status: Current Every Day Smoker    Packs/day: 1.00    Types: Cigarettes  . Smokeless tobacco: Never Used  Substance and Sexual Activity  . Alcohol use: Yes    Comment: occ.  . Drug use: No  . Sexual activity: Not on file  Other Topics Concern  . Not on file  Social History Narrative   Right handed   Lives in single story home with sister and daughter   Drinks lots of caffine, coffe and energy drink, and 2-3 sodas a day   Social Determinants of Health   Financial Resource Strain:   . Difficulty of Paying Living Expenses:   Food Insecurity:   . Worried About Programme researcher, broadcasting/film/video in the Last Year:   . Barista in the Last Year:   Transportation Needs:   . Freight forwarder (Medical):   Marland Kitchen Lack of Transportation (Non-Medical):   Physical Activity:   . Days of Exercise per Week:   . Minutes of Exercise per Session:   Stress:   . Feeling of Stress :   Social Connections:   . Frequency of Communication with Friends and Family:   . Frequency of Social Gatherings with Friends and Family:   . Attends Religious Services:   . Active Member of Clubs or  Organizations:   . Attends Banker Meetings:   Marland Kitchen Marital Status:   Intimate Partner Violence:   . Fear of Current or Ex-Partner:   . Emotionally Abused:   Marland Kitchen Physically Abused:   . Sexually Abused:     Observations/Objective:   There were no vitals taken for this visit. No acute distress.  Alert and oriented.  Speech fluent and not dysarthric.  Language intact.  Eyes orthophoric on primary gaze.  Face symmetric.  Assessment and Plan:   Migraine without aura, without status migrainosus, not intractable.  Improved on Aimovig  1.  For preventative management, Aimovig 140mg  2.  Limit use of pain relievers to no more than 2 days out of week to prevent risk of rebound or medication-overuse headache. 3.  Keep headache diary 4.  Exercise, hydration, caffeine cessation, sleep hygiene, monitor for and avoid triggers 5.  Follow up 6 months   Follow Up Instructions:    -I discussed the assessment and treatment plan with the patient. The patient was provided an opportunity to ask questions and all were answered. The patient agreed with the plan and demonstrated an understanding of the instructions.   The patient was advised to call back or seek an in-person evaluation if the symptoms worsen or if the condition fails to improve as anticipated.    , DO

## 2019-09-17 ENCOUNTER — Other Ambulatory Visit: Payer: Self-pay

## 2019-09-17 ENCOUNTER — Encounter: Payer: Self-pay | Admitting: Neurology

## 2019-09-17 ENCOUNTER — Telehealth (INDEPENDENT_AMBULATORY_CARE_PROVIDER_SITE_OTHER): Payer: Medicaid Other | Admitting: Neurology

## 2019-09-17 DIAGNOSIS — G43009 Migraine without aura, not intractable, without status migrainosus: Secondary | ICD-10-CM | POA: Diagnosis not present

## 2020-01-07 ENCOUNTER — Other Ambulatory Visit: Payer: Self-pay | Admitting: Neurology

## 2020-01-07 NOTE — Telephone Encounter (Signed)
Telephone call to pt, Aimovig script written 04/2019 with 11 refills. Pt may only need a PA.  Advised pt that it  Could take up to week to get approval.

## 2020-01-07 NOTE — Telephone Encounter (Signed)
Patient needs a refill of Aimovig but states she will need a prior authorization. Please call.

## 2020-01-09 NOTE — Telephone Encounter (Signed)
Patient called in to see if the Prior Authorization for her Aimovig is complete?

## 2020-01-09 NOTE — Telephone Encounter (Signed)
Pt advised of PA started and waiting on response.

## 2020-01-20 NOTE — Progress Notes (Signed)
Received fax from [employer]- Aimovig 140mg  approval valid from 01/17/20 to 01/16/21. 3 for 90 days

## 2020-03-17 NOTE — Progress Notes (Signed)
   Due to the COVID-19 crisis, this virtual check-in visit was done via telephone from my office and it was initiated and consent given by this patient and or family.  Telephone (Audio) Visit The purpose of this telephone visit is to provide medical care while limiting exposure to the novel coronavirus.  Attempted a video virtual visit but patient was unable to connect with the text link.  Consent was obtained for telephone visit and initiated by pt/family:  Yes.   Answered questions that patient had about telehealth interaction:  Yes.   I discussed the limitations, risks, security and privacy concerns of performing an evaluation and management service by telephone. I also discussed with the patient that there may be a patient responsible charge related to this service. The patient expressed understanding and agreed to proceed.  Pt location: Home Physician Location: office Name of referring provider:  [facility] I connected with .Natalie Acevedo at patients initiation/request on 03/23/2020 at  2:30 PM EST by telephone and verified that I am speaking with the correct person using two identifiers.  Pt MRN:  [id number] Pt DOB:  23-Mar-1971  Assessment/Plan:   Migraine without aura  1. Aimovig 140mg  monthly 2.  Goody powder.  Limit use of pain relievers to no more than 2 days out of week to prevent risk of rebound or medication-overuse headache. 3.  Keep headache diary 4.  9 months.   Need for in person visit now:  No.  Subjective:   Natalie Acevedo is a 49 year old woman whofollows up for migraines.  UPDATE: Intensity:  Moderate Duration:  1 hour Frequency:  2 days a week. Frequency of abortive medication:daily Current NSAIDS:Mobic Current analgesics:Goody powder Current triptans:none Current muscle relaxants:Tizanidine 4mg  Current antihypertensive: Losartan, HCTZ Current Antidepressant medications:Lexapro, Wellbutrin Current antiepileptic  medication:none Current CGRP inhibitor:  Aimovig 140mg    HISTORY: Onset:April 2020. She reports feeling menopausal. She is feeling more depressed and irritable. Many years ago, she had minor daily headaches which responded to acupuncture. Location:bifrontal Quality:throbbing Initialtensity:10/10. Shedenies new headache, thunderclap headache  Aura:no Premonitory Phase:no Postdrome:no Associated symptoms:Sometimes nausea.44 associated vomiting, photophobia, phonophobia, visual disturbance orunilateral numbness or weakness. Initial duration:Varies hour to several hours Initial frequency:daily Initial frequency of abortive medication:Excedrin Migraine daily. Sumatriptan once in past month. Triggers:Valsalva (coughting, sneezing) Relieving factors:no Activity:Aggravates. Sometimes dizziness and staggering. Some memory issues.  MRI of brain with and without contrast from 10/15/2018 was unremarkable.   Past triptan: Sumatriptan 50mg ;rizatriptan 10mg  Pastanticonvulsant: topiramate100mg ; gabapentin 800mg    Smoker:1 ppd Depression:yes; Anxiety:yes. She says she is menopausal. She is more irritable. Endorses mood swings Family history of headache:no    Objective:   There were no vitals filed for this visit.   Follow Up Instructions:      -I discussed the assessment and treatment plan with the patient. The patient was provided an opportunity to ask questions and all were answered. The patient agreed with the plan and demonstrated an understanding of the instructions.   The patient was advised to call back or seek an in-person evaluation if the symptoms worsen or if the condition fails to improve as anticipated.    Total Time spent in visit with the patient was:  8 minutes.   , DO   Natalie Vida Payton Doughty, DO

## 2020-03-23 ENCOUNTER — Other Ambulatory Visit: Payer: Self-pay

## 2020-03-23 ENCOUNTER — Telehealth (INDEPENDENT_AMBULATORY_CARE_PROVIDER_SITE_OTHER): Payer: Medicaid Other | Admitting: Neurology

## 2020-03-23 ENCOUNTER — Encounter: Payer: Self-pay | Admitting: Neurology

## 2020-03-23 DIAGNOSIS — G43009 Migraine without aura, not intractable, without status migrainosus: Secondary | ICD-10-CM

## 2020-03-23 MED ORDER — AIMOVIG 140 MG/ML ~~LOC~~ SOAJ
140.0000 mg | SUBCUTANEOUS | 1 refills | Status: DC
Start: 2020-03-23 — End: 2020-06-23

## 2020-05-09 ENCOUNTER — Other Ambulatory Visit: Payer: Self-pay | Admitting: Neurology

## 2020-06-22 ENCOUNTER — Other Ambulatory Visit: Payer: Self-pay | Admitting: Neurology

## 2020-06-23 NOTE — Telephone Encounter (Signed)
Patient called to check on the status of the request. °

## 2020-06-23 NOTE — Telephone Encounter (Signed)
Patient called in wanting a refill of her Aimovig

## 2020-07-18 ENCOUNTER — Other Ambulatory Visit: Payer: Self-pay | Admitting: Neurology

## 2020-08-19 ENCOUNTER — Other Ambulatory Visit: Payer: Self-pay | Admitting: Neurology

## 2020-09-16 ENCOUNTER — Telehealth: Payer: Self-pay | Admitting: Neurology

## 2020-09-16 NOTE — Progress Notes (Deleted)
NEUROLOGY FOLLOW UP OFFICE NOTE  Natalie Acevedo [id number]  Assessment/Plan:   Migraine without aura, without status migrainosus, not intractable  1.  Migraine prevention:  Aimovig 140mg  2.  Migraine rescue:  *** 3.  Limit use of pain relievers to no more than 2 days out of week to prevent risk of rebound or medication-overuse headache. 4.  Keep headache diary 5.  Follow up ***  Subjective:  Natalie Acevedo is a 50year old woman whofollows up for migraines.  UPDATE: Intensity:  Moderate Duration:  1 hour Frequency:  2 days a week. Frequency of abortive medication:daily Current NSAIDS:Mobic Current analgesics:Goody powder Current triptans:none Current muscle relaxants:Tizanidine 4mg  Current antihypertensive: Losartan, HCTZ Current Antidepressant medications:Lexapro, Wellbutrin Current antiepileptic medication:none Current CGRP inhibitor: Aimovig 140mg    HISTORY: Onset:April 2020. She reports feeling menopausal. She is feeling more depressed and irritable. Many years ago, she had minor daily headaches which responded to acupuncture. Location:bifrontal Quality:throbbing Initialtensity:10/10. Shedenies new headache, thunderclap headache  Aura:no Premonitory Phase:no Postdrome:no Associated symptoms:Sometimes nausea.44 associated vomiting, photophobia, phonophobia, visual disturbance orunilateral numbness or weakness. Initial duration:Varies hour to several hours Initial frequency:daily Initial frequency of abortive medication:Excedrin Migraine daily. Sumatriptan once in past month. Triggers:Valsalva (coughting, sneezing) Relieving factors:no Activity:Aggravates. Sometimes dizziness and staggering. Some memory issues.  MRI of brain with and without contrast from 10/15/2018 was unremarkable.   Past triptan: Sumatriptan 50mg ;rizatriptan 10mg  Pastanticonvulsant: topiramate100mg ; gabapentin  800mg    Smoker:1 ppd Depression:yes; Anxiety:yes. She says she is menopausal. She is more irritable. Endorses mood swings Family history of headache:no  PAST MEDICAL HISTORY: Past Medical History:  Diagnosis Date  . Asthma     MEDICATIONS: Current Outpatient Medications on File Prior to Visit  Medication Sig Dispense Refill  . AIMOVIG 140 MG/ML SOAJ INJECT 140MG  EVERY 28 DAYS 1 mL 0  . albuterol (VENTOLIN HFA) 108 (90 Base) MCG/ACT inhaler USE 2 PUFFS EVERY 6 HOURS AS NEEDED    . buPROPion (WELLBUTRIN SR) 100 MG 12 hr tablet Take by mouth.    . butalbital-acetaminophen-caffeine (FIORICET WITH CODEINE) 50-325-40-30 MG capsule TAKE 1 CAPSULE EVERY 6 HOURS AS NEEDED    . escitalopram (LEXAPRO) 10 MG tablet TAKE ONE (1) TABLET EACH DAY    . gabapentin (NEURONTIN) 800 MG tablet   1  . hydrochlorothiazide (HYDRODIURIL) 25 MG tablet TAKE ONE (1) TABLET EACH DAY    . HYDROcodone-acetaminophen (NORCO) 7.5-325 MG tablet   0  . losartan (COZAAR) 50 MG tablet TAKE ONE TABLET BY MOUTH TWICE DAILY    . meloxicam (MOBIC) 15 MG tablet   1  . rizatriptan (MAXALT) 10 MG tablet Take 1 tablet earliest onset of migraine.  May repeat in 2 hours if needed.  Maximum 2 tablets in 24 hours 10 tablet 0  . rizatriptan (MAXALT-MLT) 10 MG disintegrating tablet TAKE 1 TABLET AT ONSET OF MIGRAINE HEADACHE MAY REPEAT ONCE IN 2 HOURS (MAX OF 2 TABLETS/24 HOURS) 9 tablet 3  . tiZANidine (ZANAFLEX) 4 MG tablet   1  . topiramate (TOPAMAX) 50 MG tablet TAKE 1 TAB AT BEDTIME FOR 7 DAYS THEN TAKE 2 TABLETS AT BEDTIME 180 tablet 0   No current facility-administered medications on file prior to visit.    ALLERGIES: Allergies  Allergen Reactions  . Lisinopril Cough    FAMILY HISTORY: Family History  Problem Relation Age of Onset  . Kidney disease Mother   . Cancer Maternal Grandmother       Objective:  *** General: No acute distress.  Patient appears ***-groomed.  Head:   Normocephalic/atraumatic Eyes:  Fundi examined but not visualized Neck: supple, no paraspinal tenderness, full range of motion Heart:  Regular rate and rhythm Lungs:  Clear to auscultation bilaterally Back: No paraspinal tenderness Neurological Exam: alert and oriented to person, place, and time. Attention span and concentration intact, recent and remote memory intact, fund of knowledge intact.  Speech fluent and not dysarthric, language intact.  CN II-XII intact. Bulk and tone normal, muscle strength 5/5 throughout.  Sensation to light touch, temperature and vibration intact.  Deep tendon reflexes 2+ throughout, toes downgoing.  Finger to nose and heel to shin testing intact.  Gait normal, Romberg negative.     Shon Millet, DO  CC: ***

## 2020-09-16 NOTE — Telephone Encounter (Signed)
LMOVM to call us back with the medication that is covered by herr insurance.

## 2020-09-16 NOTE — Telephone Encounter (Signed)
Pt states that her insurance will not pay for the Aimovig  And needs to know if we can get her a different RX for something that will be covered by insurance   The drug store in [place]

## 2020-09-17 ENCOUNTER — Ambulatory Visit: Payer: Medicaid Other | Admitting: Neurology

## 2020-09-29 ENCOUNTER — Other Ambulatory Visit: Payer: Self-pay | Admitting: Neurology

## 2020-09-29 NOTE — Telephone Encounter (Signed)
Patient called in stating her insurance company said the Aimovig would be covered they just need prior authorization.

## 2020-09-29 NOTE — Telephone Encounter (Signed)
Per pt she has a different insurance then what she had in Sep. 2021.  Per pt she has [employer] now.  Please send to that insurance.

## 2020-09-30 NOTE — Telephone Encounter (Signed)
This has been sent to plan.

## 2020-09-30 NOTE — Progress Notes (Signed)
Natalie Acevedo (Key: [id number]) Aimovig 140MG /ML auto-injectors   Form [employer] Electronic Prior Authorization Form (2017 [place]) Created 25 minutes ago Sent to Plan 19 minutes ago Plan Response 18 minutes ago Submit Clinical Questions less than a minute ago Determination

## 2020-10-01 NOTE — Progress Notes (Signed)
Natalie Acevedo (Key: [id number]) Rx #: [id number] Aimovig 140MG /ML auto-injectors   Form [employer] Electronic Prior Authorization Form (2017 [place]) Created 1 day ago Sent to Plan 20 hours ago Plan Response 20 hours ago Submit Clinical Questions less than a minute ago Determination Wait for Determination Please wait for [employer] 2017 NCPDP to return a determination.

## 2020-10-27 ENCOUNTER — Telehealth: Payer: Self-pay | Admitting: Neurology

## 2020-10-27 NOTE — Telephone Encounter (Signed)
Right hand, holding things and drop it. Loses all grip. Left eye, seeing black spots. She said she doesn't even realize she drop is dropping things. Left hand is doing the same thing but not as bad.

## 2020-12-07 ENCOUNTER — Telehealth: Payer: Self-pay

## 2020-12-07 NOTE — Telephone Encounter (Signed)
Cynia Dustin Folks (Key: [id number]) Aimovig 140MG /ML auto-injectors   Form [employer] Electronic Prior Authorization Form (2017 NCP[place]) Created 5 days ago Sent to Plan 13 minutes ago Plan Response 12 minutes ago Submit Clinical Questions less than a minute ago Determination Wait for Determination Please wait for [employer] 2017 NCP[place] to return a determination.

## 2020-12-09 NOTE — Progress Notes (Signed)
NEUROLOGY FOLLOW UP OFFICE NOTE  Natalie Acevedo [id number]  Assessment/Plan:   Right hand weakness - would evaluate for stroke.  No pain or numbness to suggest radiculopathy/carpal tunnel syndrome Migraine without aura, without status migrainosus, not intractable Hypertension Tobacco use disorder  Plan: MRI of brain without contrast - further recommendations pending results. Aimovig 140mg  Q4wks Limit use of pain relievers to no more than 2 days out of week to prevent risk of rebound or medication-overuse headache. Keep headache diary Follow up with PCP regarding blood pressure Smoking cessation Follow up 6 months.  Subjective:  Natalie Acevedo is a 50 year old woman who follows up for migraines.   UPDATE: She started having new symptoms 3 to 4 weeks ago.  She will suddenly drop objects from her right hand.  No neck or radicular pain or numbness.  No involvement of her leg.  Once in awhile she will see a black spot in the mid lower half of visual field only in the left eye lasting about 3 minutes.  No associated headache.  It has occurred about 3 or 4 times.    Migraines have been stable. Intensity:  Moderate Duration:  1 hour Frequency:  2 days a week. Frequency of abortive medication: daily Current NSAIDS:  Mobic Current analgesics:  Goody powder Current triptans:  none Current muscle relaxants:  Tizanidine 4mg  Current antihypertensive:  Losartan, HCTZ Current Antidepressant medications:  Lexapro, Wellbutrin Current antiepileptic medication:  none Current CGRP inhibitor:  Aimovig 140mg      HISTORY:  Onset:  April 2020.  She reports feeling menopausal.  She is feeling more depressed and irritable.  Many years ago, she had minor daily headaches which responded to acupuncture. Location:  bifrontal Quality:  throbbing Initial tensity:  10/10.  She denies new headache, thunderclap headache  Aura:  no Premonitory Phase:  no Postdrome:  no Associated symptoms:  Sometimes  nausea..  She denies associated vomiting, photophobia, phonophobia, visual disturbance or unilateral numbness or weakness. Initial duration:  Varies hour to several hours Initial frequency:  daily Initial frequency of abortive medication: Excedrin Migraine daily.  Sumatriptan once in past month. Triggers:  Valsalva (coughting, sneezing) Relieving factors:  no Activity:  Aggravates. Sometimes dizziness and staggering.  Some memory issues.   MRI of brain with and without contrast from 10/15/2018 was unremarkable.     Past triptan:  Sumatriptan 50mg ; rizatriptan 10mg  Past anticonvulsant:  topiramate 100mg ; gabapentin 800mg      Smoker:  1  ppd Depression:  yes; Anxiety:  yes.  She says she is menopausal.  She is more irritable.  Endorses mood swings Family history of headache:  no  PAST MEDICAL HISTORY: Past Medical History:  Diagnosis Date   Asthma     MEDICATIONS: Current Outpatient Medications on File Prior to Visit  Medication Sig Dispense Refill   AIMOVIG 140 MG/ML SOAJ INJECT 140MG  EVERY 28 DAYS 1 mL 3   albuterol (VENTOLIN HFA) 108 (90 Base) MCG/ACT inhaler USE 2 PUFFS EVERY 6 HOURS AS NEEDED     buPROPion (WELLBUTRIN SR) 100 MG 12 hr tablet Take by mouth.     butalbital-acetaminophen-caffeine (FIORICET WITH CODEINE) 50-325-40-30 MG capsule TAKE 1 CAPSULE EVERY 6 HOURS AS NEEDED     escitalopram (LEXAPRO) 10 MG tablet TAKE ONE (1) TABLET EACH DAY     gabapentin (NEURONTIN) 800 MG tablet   1   hydrochlorothiazide (HYDRODIURIL) 25 MG tablet TAKE ONE (1) TABLET EACH DAY     HYDROcodone-acetaminophen (NORCO) 7.5-325 MG tablet  0   losartan (COZAAR) 50 MG tablet TAKE ONE TABLET BY MOUTH TWICE DAILY     meloxicam (MOBIC) 15 MG tablet   1   rizatriptan (MAXALT) 10 MG tablet Take 1 tablet earliest onset of migraine.  May repeat in 2 hours if needed.  Maximum 2 tablets in 24 hours 10 tablet 0   rizatriptan (MAXALT-MLT) 10 MG disintegrating tablet TAKE 1 TABLET AT ONSET OF MIGRAINE  HEADACHE MAY REPEAT ONCE IN 2 HOURS (MAX OF 2 TABLETS/24 HOURS) 9 tablet 3   tiZANidine (ZANAFLEX) 4 MG tablet   1   topiramate (TOPAMAX) 50 MG tablet TAKE 1 TAB AT BEDTIME FOR 7 DAYS THEN TAKE 2 TABLETS AT BEDTIME 180 tablet 0   No current facility-administered medications on file prior to visit.    ALLERGIES: Allergies  Allergen Reactions   Lisinopril Cough    FAMILY HISTORY: Family History  Problem Relation Age of Onset   Kidney disease Mother    Cancer Maternal Grandmother       Objective:  Blood pressure (!) 159/89, pulse (!) 120, height 5\' 2"  (1.575 m), weight 240 lb 6.4 oz (109 kg), SpO2 93 %. General: No acute distress.  Patient appears well-groomed.   Head:  Normocephalic/atraumatic Eyes:  Fundi examined but not visualized Neck: supple, no paraspinal tenderness, full range of motion Heart:  Regular rate and rhythm Lungs:  Clear to auscultation bilaterally Back: No paraspinal tenderness Neurological Exam: alert and oriented to person, place, and time.  Speech fluent and not dysarthric, language intact.  CN II-XII intact. Bulk and tone normal, muscle strength 5/5 throughout.  Sensation to light touch intact.  Deep tendon reflexes 2+ throughout, toes downgoing.  Finger to nose testing intact.  Gait normal, Romberg negative.   , DO

## 2020-12-10 ENCOUNTER — Encounter: Payer: Self-pay | Admitting: Neurology

## 2020-12-10 ENCOUNTER — Ambulatory Visit: Payer: Medicaid Other | Admitting: Neurology

## 2020-12-10 ENCOUNTER — Other Ambulatory Visit: Payer: Self-pay

## 2020-12-10 VITALS — BP 159/89 | HR 120 | Ht 62.0 in | Wt 240.4 lb

## 2020-12-10 DIAGNOSIS — F17200 Nicotine dependence, unspecified, uncomplicated: Secondary | ICD-10-CM

## 2020-12-10 DIAGNOSIS — R29898 Other symptoms and signs involving the musculoskeletal system: Secondary | ICD-10-CM | POA: Diagnosis not present

## 2020-12-10 DIAGNOSIS — I10 Essential (primary) hypertension: Secondary | ICD-10-CM | POA: Diagnosis not present

## 2020-12-10 DIAGNOSIS — G43009 Migraine without aura, not intractable, without status migrainosus: Secondary | ICD-10-CM

## 2020-12-10 NOTE — Patient Instructions (Signed)
Will get MRI of brain Continue Aimovig 140mg  every 28 days Limit use of pain relievers to no more than 2 days out of week to prevent risk of rebound or medication-overuse headache.   Follow up 6 months.

## 2020-12-15 NOTE — Telephone Encounter (Signed)
F/u    Approved through 12/07/2021   Request Reference Number: [id number]. AIMOVIG INJ 140MG /ML is approved through 12/07/2021.

## 2020-12-21 ENCOUNTER — Other Ambulatory Visit: Payer: Medicaid Other

## 2020-12-27 ENCOUNTER — Ambulatory Visit
Admission: RE | Admit: 2020-12-27 | Discharge: 2020-12-27 | Disposition: A | Discharge status: Home / Self Care | Payer: Medicaid Other | Source: Ambulatory Visit | Attending: Neurology | Admitting: Neurology

## 2020-12-27 DIAGNOSIS — R29898 Other symptoms and signs involving the musculoskeletal system: Secondary | ICD-10-CM

## 2020-12-27 DIAGNOSIS — G43009 Migraine without aura, not intractable, without status migrainosus: Secondary | ICD-10-CM

## 2020-12-29 ENCOUNTER — Telehealth: Payer: Self-pay | Admitting: Neurology

## 2020-12-29 NOTE — Telephone Encounter (Signed)
Pt said she has missed 3 calls from our office regarding results from an MRI

## 2020-12-29 NOTE — Progress Notes (Signed)
Patient advised of MRI results, voiced understanding  

## 2020-12-29 NOTE — Telephone Encounter (Signed)
Pt notified of MRI

## 2020-12-29 NOTE — Progress Notes (Signed)
Left message for patient to return call regarding MRI results.

## 2021-01-07 ENCOUNTER — Other Ambulatory Visit: Payer: Self-pay | Admitting: Neurology

## 2021-01-21 ENCOUNTER — Ambulatory Visit: Payer: Medicaid Other | Admitting: Neurology

## 2021-02-01 ENCOUNTER — Ambulatory Visit: Payer: Medicaid Other | Admitting: Neurology

## 2021-02-16 ENCOUNTER — Other Ambulatory Visit: Payer: Self-pay

## 2021-02-16 MED ORDER — PREDNISONE 10 MG (21) PO TBPK: ORAL_TABLET | ORAL | 0 refills | Status: DC

## 2021-02-16 NOTE — Progress Notes (Signed)
Per Dr.Jaffe, okay to send to Prednisone taper  Taper sent to the pharmacy on file.

## 2021-02-17 ENCOUNTER — Other Ambulatory Visit: Payer: Self-pay

## 2021-02-17 ENCOUNTER — Ambulatory Visit (INDEPENDENT_AMBULATORY_CARE_PROVIDER_SITE_OTHER): Payer: Medicaid Other

## 2021-02-17 DIAGNOSIS — G43009 Migraine without aura, not intractable, without status migrainosus: Secondary | ICD-10-CM | POA: Diagnosis not present

## 2021-02-17 MED ORDER — METOCLOPRAMIDE HCL 5 MG/ML IJ SOLN
10.0000 mg | Freq: Once | INTRAMUSCULAR | Status: AC
  Administered 2021-02-17: 10 mg via INTRAMUSCULAR

## 2021-02-17 MED ORDER — DIPHENHYDRAMINE HCL 50 MG/ML IJ SOLN
25.0000 mg | Freq: Once | INTRAMUSCULAR | Status: AC
  Administered 2021-02-17: 25 mg via INTRAMUSCULAR

## 2021-02-17 MED ORDER — KETOROLAC TROMETHAMINE 60 MG/2ML IM SOLN
60.0000 mg | Freq: Once | INTRAMUSCULAR | Status: AC
  Administered 2021-02-17: 60 mg via INTRAMUSCULAR

## 2021-02-17 NOTE — Progress Notes (Signed)
Medication Samples have been provided to the patient.  Drug name: Montey Hora       Strength: 100 mg        Qty: 4  LOT: [id number]  Exp.Date: 10/2021  Dosing instructions: as needed  The patient has been instructed regarding the correct time, dose, and frequency of taking this medication, including desired effects and most common side effects.   Leida Lauth 2:23 PM 02/17/2021

## 2021-06-25 ENCOUNTER — Ambulatory Visit: Payer: Medicaid Other | Admitting: Neurology

## 2021-07-14 ENCOUNTER — Other Ambulatory Visit: Payer: Self-pay | Admitting: Neurology

## 2021-08-16 NOTE — Progress Notes (Deleted)
NEUROLOGY FOLLOW UP OFFICE NOTE  Natalie Acevedo [id number]  Assessment/Plan:   Migraine without aura, without status migrainosus, not intractable Multiple symptomatology.  Subjective right hand weakness, memory problems, tremors - neurologic workup negative.  Hypertension Tobacco use disorder   Plan: MRI of brain without contrast - further recommendations pending results. Aimovig 140mg  Q4wks Limit use of pain relievers to no more than 2 days out of week to prevent risk of rebound or medication-overuse headache. Keep headache diary Follow up with PCP regarding blood pressure Smoking cessation Follow up 6 months.   Subjective:  Natalie Acevedo is a 51 year old woman who follows up for migraines.   UPDATE: Due to right hand weakness, she had an MRI of brain without contrast on 12/27/2020 which was personally reviewed and was normal.     Migraines have been stable. Intensity:  Moderate Duration:  1 hour Frequency:  2 days a week. Frequency of abortive medication: daily Current NSAIDS:  Mobic Current analgesics:  Goody powder Current triptans:  none Current muscle relaxants:  Tizanidine 4mg  Current antihypertensive:  Losartan, HCTZ Current Antidepressant medications:  Lexapro, Wellbutrin Current antiepileptic medication:  none Current CGRP inhibitor:  Aimovig 140mg      HISTORY:  Onset:  April 2020.  She reports feeling menopausal.  She is feeling more depressed and irritable.  Many years ago, she had minor daily headaches which responded to acupuncture. Location:  bifrontal Quality:  throbbing Initial tensity:  10/10.  She denies new headache, thunderclap headache  Aura:  no Premonitory Phase:  no Postdrome:  no Associated symptoms:  Sometimes nausea..  She denies associated vomiting, photophobia, phonophobia, visual disturbance or unilateral numbness or weakness. Initial duration:  Varies hour to several hours Initial frequency:  daily Initial frequency of abortive  medication: Excedrin Migraine daily.  Sumatriptan once in past month. Triggers:  Valsalva (coughting, sneezing) Relieving factors:  no Activity:  Aggravates.  MRI of brain with and without contrast from 10/15/2018 was unremarkable.  In summer 2022, she noted right hand weakness.  She will suddenly drop objects from her right hand.  No neck or radicular pain or numbness.  No involvement of her leg.  Once in awhile she will see a black spot in the mid lower half of visual field only in the left eye lasting about 3 minutes.  No associated headache.  It has occurred about 3 or 4 times.   She also reports tremors and memory problems.       Past triptan:  Sumatriptan 50mg ; rizatriptan 10mg  Past anticonvulsant:  topiramate 100mg ; gabapentin 800mg      Smoker:  1  ppd Depression:  yes; Anxiety:  yes.  She says she is menopausal.  She is more irritable.  Endorses mood swings Family history of headache:  no  PAST MEDICAL HISTORY: Past Medical History:  Diagnosis Date   Asthma     MEDICATIONS: Current Outpatient Medications on File Prior to Visit  Medication Sig Dispense Refill   AIMOVIG 140 MG/ML SOAJ INJECT 140MG  EVERY 28 DAYS 1 mL 5   albuterol (VENTOLIN HFA) 108 (90 Base) MCG/ACT inhaler USE 2 PUFFS EVERY 6 HOURS AS NEEDED     buPROPion (WELLBUTRIN SR) 100 MG 12 hr tablet Take by mouth.     butalbital-acetaminophen-caffeine (FIORICET WITH CODEINE) 50-325-40-30 MG capsule TAKE 1 CAPSULE EVERY 6 HOURS AS NEEDED     escitalopram (LEXAPRO) 10 MG tablet TAKE ONE (1) TABLET EACH DAY     gabapentin (NEURONTIN) 800 MG tablet  1   hydrochlorothiazide (HYDRODIURIL) 25 MG tablet TAKE ONE (1) TABLET EACH DAY     HYDROcodone-acetaminophen (NORCO) 7.5-325 MG tablet   0   losartan (COZAAR) 50 MG tablet TAKE ONE TABLET BY MOUTH TWICE DAILY     meloxicam (MOBIC) 15 MG tablet   1   predniSONE (STERAPRED UNI-PAK 21 TAB) 10 MG (21) TBPK tablet take 60mg  day 1, then 50mg  day 2, then 40mg  day 3, then 30mg  day  4, then 20mg  day 5, then 10mg  day 6, then STOP 21 tablet 0   rizatriptan (MAXALT) 10 MG tablet Take 1 tablet earliest onset of migraine.  May repeat in 2 hours if needed.  Maximum 2 tablets in 24 hours 10 tablet 0   rizatriptan (MAXALT-MLT) 10 MG disintegrating tablet TAKE 1 TABLET AT ONSET OF MIGRAINE HEADACHE MAY REPEAT ONCE IN 2 HOURS (MAX OF 2 TABLETS/24 HOURS) 9 tablet 3   tiZANidine (ZANAFLEX) 4 MG tablet   1   topiramate (TOPAMAX) 50 MG tablet TAKE 1 TAB AT BEDTIME FOR 7 DAYS THEN TAKE 2 TABLETS AT BEDTIME 180 tablet 0   No current facility-administered medications on file prior to visit.    ALLERGIES: Allergies  Allergen Reactions   Lisinopril Cough    FAMILY HISTORY: Family History  Problem Relation Age of Onset   Kidney disease Mother    Cancer Maternal Grandmother       Objective:  *** General: No acute distress.  Patient appears ***-groomed.   Head:  Normocephalic/atraumatic Eyes:  Fundi examined but not visualized Neck: supple, no paraspinal tenderness, full range of motion Heart:  Regular rate and rhythm Lungs:  Clear to auscultation bilaterally Back: No paraspinal tenderness Neurological Exam: alert and oriented to person, place, and time.  Speech fluent and not dysarthric, language intact.  CN II-XII intact. Bulk and tone normal, muscle strength 5/5 throughout.  Sensation to light touch intact.  Deep tendon reflexes 2+ throughout, toes downgoing.  Finger to nose testing intact.  Gait normal, Romberg negative.   , DO  CC: ***

## 2021-08-17 ENCOUNTER — Encounter: Payer: Self-pay | Admitting: Neurology

## 2021-08-17 ENCOUNTER — Ambulatory Visit: Payer: Medicaid Other | Admitting: Neurology

## 2021-08-17 DIAGNOSIS — Z029 Encounter for administrative examinations, unspecified: Secondary | ICD-10-CM

## 2021-10-03 IMAGING — MR MR HEAD W/O CM
11 series · 48 of 48 positions shown · non-contrast
Comparison: Brain MRI 10/15/2018.

CLINICAL DATA: Right hand weakness ZFV.2V2 (GIL-2Q-CM). Right hand
weakness. Migraine without Tiger and without status migrainosus, not
intractable 2CQ.BBB (GIL-2Q-CM). Additional history provided by
scanning technologist: Patient reports weakness/numbness in
bilateral arms/hands, tremors in bilateral hands, "black spots" in
peripheral vision (left eye). Migraines and confusion for 1-2
months. History of head injury with loss of consciousness.

EXAM:
MRI HEAD WITHOUT CONTRAST
TECHNIQUE: Multiplanar, multiecho pulse sequences of the brain and surrounding
structures were obtained without intravenous contrast.

[Series 2: T1 · sagittal · 5.0mm · 0.47mm/px · 1 of 25 slices shown]
[im 1/25]
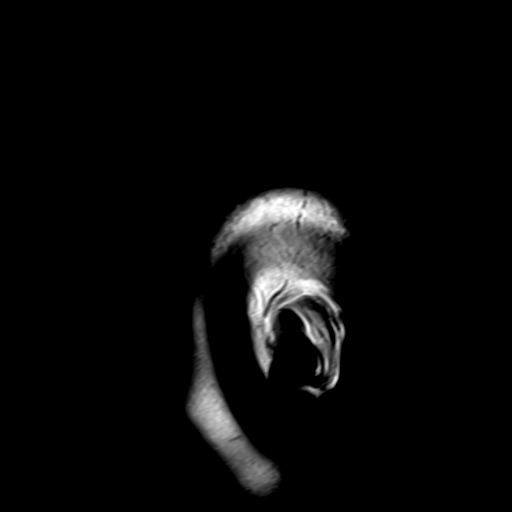

[Series 3: ax ep2d_diff_3 · axial · 3.0mm · 1.88mm/px · z∈[-84,+84]mm · 7 of 113 slices shown]
[im 1/113]
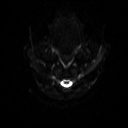
[im 19/113]
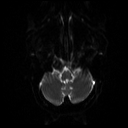
[im 38/113]
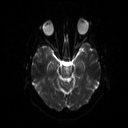
[im 57/113]
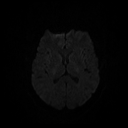
[im 75/113]
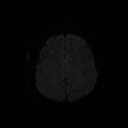
[im 94/113]
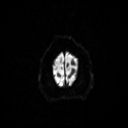
[im 113/113]
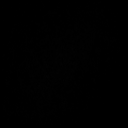

[Series 4: ax ep2d_diff_3_adc · axial · 3.0mm · 1.88mm/px · z∈[-84,+84]mm · 4 of 57 slices shown]
[im 1/57]
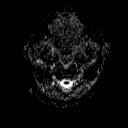
[im 19/57]
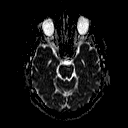
[im 38/57]
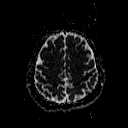
[im 57/57]
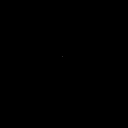

[Series 5: cor ep2d_diff · coronal · 5.0mm · 1.77mm/px · 4 of 54 slices shown]
[im 1/54]
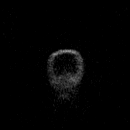
[im 18/54]
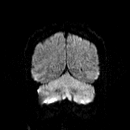
[im 36/54]
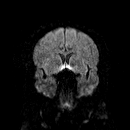
[im 54/54]
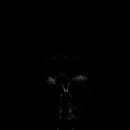

[Series 6: cor ep2d_diff_adc · coronal · 5.0mm · 1.77mm/px · 2 of 28 slices shown]
[im 1/28]
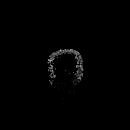
[im 28/28]
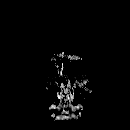

[Series 7: mip_images(sw) · axial · 16.0mm · 0.94mm/px · z∈[-80,+80]mm · 5 of 81 slices shown]
[im 1/81]
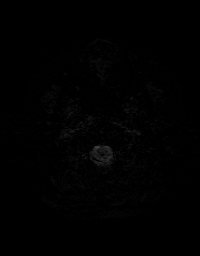
[im 21/81]
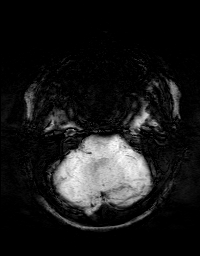
[im 41/81]
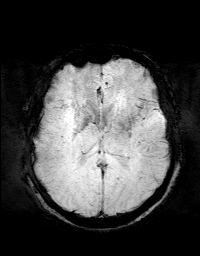
[im 61/81]
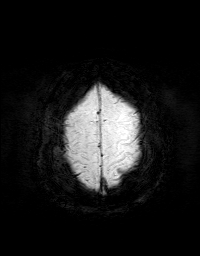
[im 81/81]
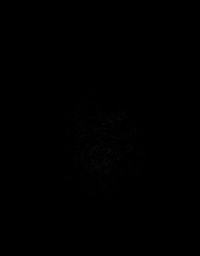

[Series 8: swi_images · axial · 2.0mm · 0.94mm/px · z∈[-87,+87]mm · 6 of 88 slices shown]
[im 1/88]
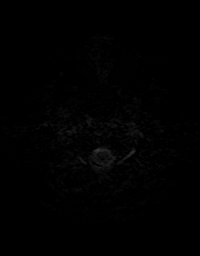
[im 18/88]
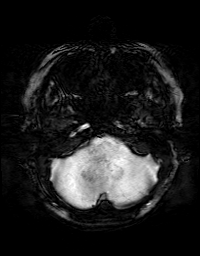
[im 35/88]
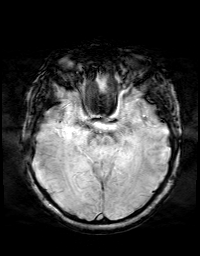
[im 53/88]
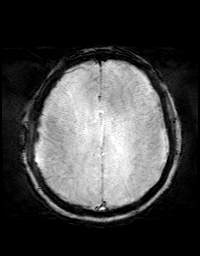
[im 70/88]
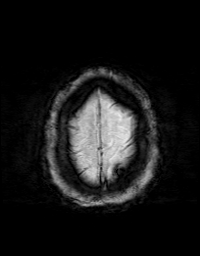
[im 88/88]
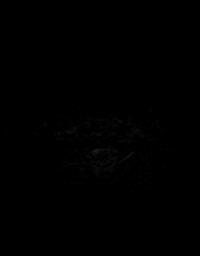

[Series 9: FLAIR · axial · 3.0mm · 0.47mm/px · z∈[-86,+78]mm · 3 of 43 slices shown]
[im 1/43]
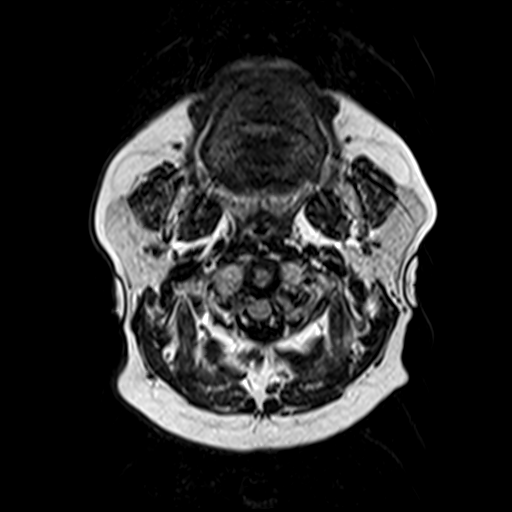
[im 22/43]
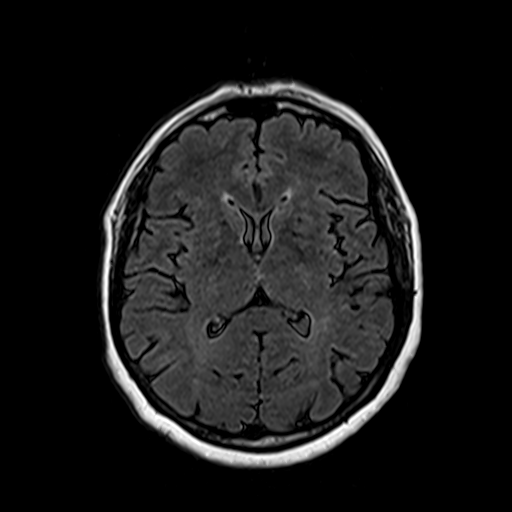
[im 43/43]
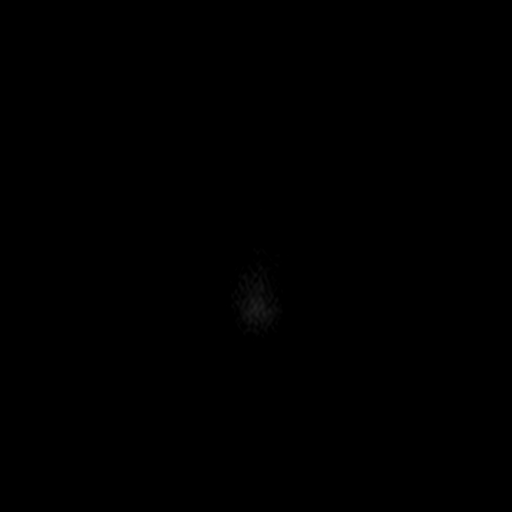

[Series 10: T2 · axial · 5.0mm · 0.78mm/px · z∈[-87,+87]mm · 2 of 30 slices shown (1 of 2)]
[im 1/30]
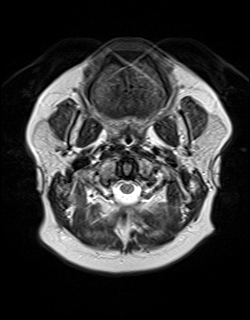
[im 30/30]
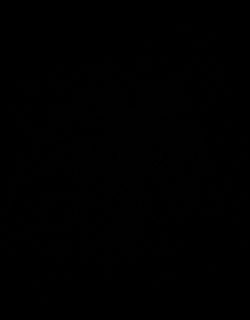

[Series 11: t1_mpr_tra · axial · 1.0mm · 0.75mm/px · z∈[-87,+88]mm · 12 of 176 slices shown]
[im 1/176]
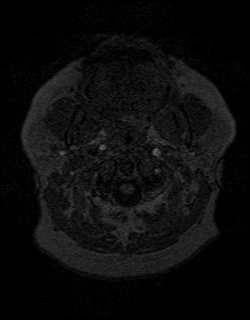
[im 16/176]
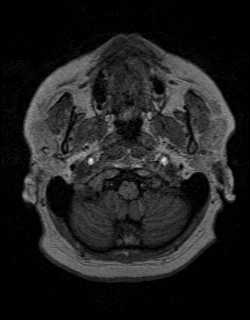
[im 32/176]
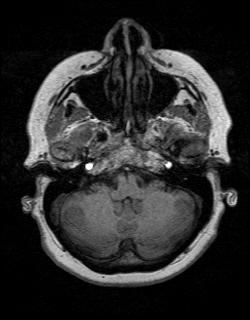
[im 48/176]
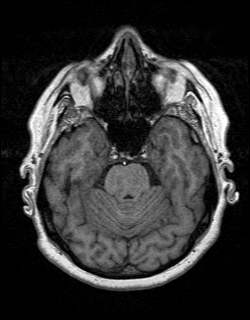
[im 64/176]
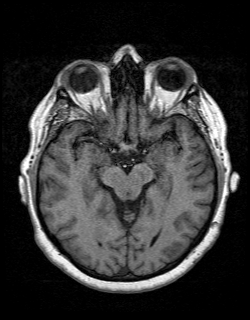
[im 80/176]
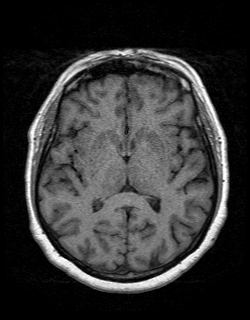
[im 96/176]
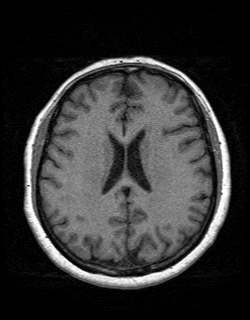
[im 112/176]
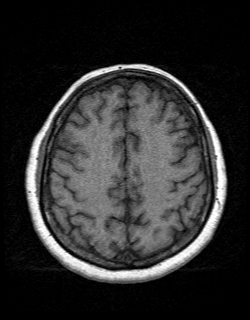
[im 128/176]
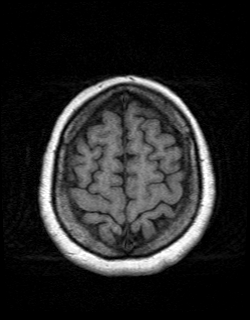
[im 144/176]
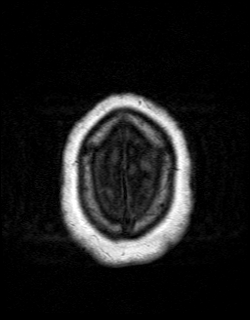
[im 160/176]
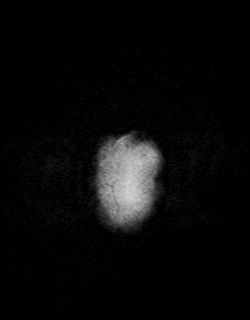
[im 176/176]
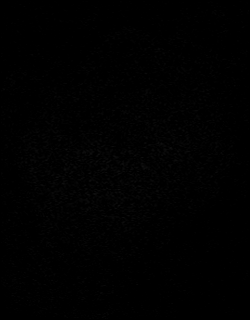

[Series 12: T2 · coronal · 5.0mm · 0.43mm/px · 2 of 29 slices shown (2 of 2)]
[im 1/29]
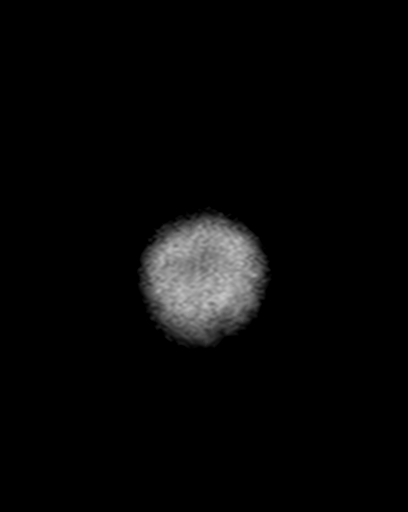
[im 29/29]
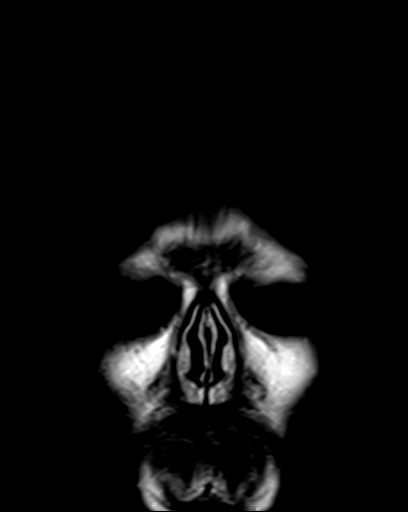

[48 of 48 positions shown; findings below may reference images not displayed]

FINDINGS: Brain:

Mild intermittent motion degradation.

Cerebral volume is normal.

No cortical encephalomalacia is identified. No significant cerebral
white matter disease. Apparent symmetric T2/FLAIR hyperintensity
within the posterior pons and bilateral cerebellar hemispheres on
the axial T2 TSE sequence and axial T2/FLAIR sequence is not
appreciated on the coronal T2 TSE sequence and appears artifactual.

There is no acute infarct.

No evidence of an intracranial mass.

No chronic intracranial blood products.

No extra-axial fluid collection.

No midline shift.

Vascular: Maintained flow voids within the proximal large arterial
vessels.

Skull and upper cervical spine: No focal suspicious marrow lesion.

Sinuses/Orbits: Visualized orbits show no acute finding. Mild
mucosal thickening within the right maxillary sinus and within a
posterior left ethmoid air cell.
IMPRESSION: Mildly motion degraded exam.

Unremarkable non-contrast MRI appearance of the brain. No evidence
of acute intracranial abnormality.

Mild mucosal thickening within the right maxillary and left ethmoid
sinuses.

## 2021-11-03 NOTE — Progress Notes (Unsigned)
NEUROLOGY FOLLOW UP OFFICE NOTE  Natalie Acevedo [id number]  Assessment/Plan:   Right hand weakness - MRI negative for stroke.  No pain or numbness to suggest radiculopathy/carpal tunnel syndrome.  No objective weakness on exam.  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   Plan: Aimovig 140mg  Q4wks Responded well to .  Send prescription. Limit use of pain relievers to no more than 2 days out of week to prevent risk of rebound or medication-overuse headache. Keep headache diary Follow up with PCP regarding possibility of tapering off gabapentin to see if twitching resolves. Follow up in one year or as needed.   Subjective:  Natalie Acevedo is a 51 year old woman who follows up for migraines.   UPDATE: Last summer, she reported new right upper extremity weakness.   She will suddenly drop objects from her right hand.  No neck or radicular pain or numbness.No involvement of her leg.  Once in awhile she will see a black spot in the mid lower half of visual field only in the left eye lasting about 3 minutes.  No associated headache.  It has occurred about 3 or 4 times.  MRI of brain without contrast on 12/27/2020 personally reviewed was unremarkable.  Still may drop an object here and there.  Notes twitching of upper extremities from time to time.  Migraines are very infrequent.  Respond well to Ubrelvy (tried samples).  May have a severe migraine once in a while.     Migraines have been stable. Intensity:  Moderate Duration:  1 hour Frequency:  2 days a week. Frequency of abortive medication: daily Current NSAIDS:  none Current analgesics:  none Current triptans:  none Current muscle relaxants:  Tizanidine 4mg  Current antihypertensive:  Losartan, HCTZ Current Antidepressant medications:  Lexapro, Wellbutrin Current antiepileptic medication:  gabapentin Current CGRP inhibitor:  Aimovig 140mg ,  Ubrelvy (sample)     HISTORY:  Onset:  April 2020.  She reports feeling menopausal.  She is feeling more depressed and irritable.  Many years ago, she had minor daily headaches which responded to acupuncture. Location:  bifrontal Quality:  throbbing Initial tensity:  10/10.  She denies new headache, thunderclap headache  Aura:  no Premonitory Phase:  no Postdrome:  no Associated symptoms:  Sometimes nausea..  She denies associated vomiting, photophobia, phonophobia, visual disturbance or unilateral numbness or weakness. Initial duration:  Varies hour to several hours Initial frequency:  daily Initial frequency of abortive medication: Excedrin Migraine daily.  Sumatriptan once in past month. Triggers:  Valsalva (coughting, sneezing) Relieving factors:  no Activity:  Aggravates. Sometimes dizziness and staggering.  Some memory issues.   MRI of brain with and without contrast from 10/15/2018 was unremarkable.     Past triptan:  Sumatriptan 50mg ; rizatriptan 10mg  Past anticonvulsant:  topiramate 100mg ;     Smoker:  1  ppd Depression:  yes; Anxiety:  yes.  She says she is menopausal.  She is more irritable.  Endorses mood swings Family history of headache:  no  PAST MEDICAL HISTORY: Past Medical History:  Diagnosis Date   Asthma     MEDICATIONS: Current Outpatient Medications on File Prior to Visit  Medication Sig Dispense Refill   albuterol (VENTOLIN HFA) 108 (90 Base) MCG/ACT inhaler USE 2 PUFFS EVERY 6 HOURS AS NEEDED     buPROPion (WELLBUTRIN SR) 100 MG 12 hr tablet Take by mouth.     butalbital-acetaminophen-caffeine (FIORICET WITH CODEINE) 50-325-40-30 MG  capsule TAKE 1 CAPSULE EVERY 6 HOURS AS NEEDED     escitalopram (LEXAPRO) 10 MG tablet TAKE ONE (1) TABLET EACH DAY     gabapentin (NEURONTIN) 800 MG tablet   1   hydrochlorothiazide (HYDRODIURIL) 25 MG tablet TAKE ONE (1) TABLET EACH DAY     HYDROcodone-acetaminophen (NORCO) 7.5-325 MG tablet   0   losartan (COZAAR)  50 MG tablet TAKE ONE TABLET BY MOUTH TWICE DAILY     QUEtiapine (SEROQUEL) 50 MG tablet Take 50 mg by mouth at bedtime.     tiZANidine (ZANAFLEX) 4 MG tablet   1   No current facility-administered medications on file prior to visit.      ALLERGIES: Allergies  Allergen Reactions   Lisinopril Cough    FAMILY HISTORY: Family History  Problem Relation Age of Onset   Kidney disease Mother    Cancer Maternal Grandmother       Objective:  Blood pressure 120/63, pulse 97, height 5\' 2"  (1.575 m), weight 227 lb (103 kg), SpO2 93 %. General: No acute distress.  Patient appears well-groomed.   Head:  Normocephalic/atraumatic Eyes:  Fundi examined but not visualized Neck: supple, no paraspinal tenderness, full range of motion Heart:  Regular rate and rhythm Neurological Exam: alert and oriented to person, place, and time.  Speech fluent and not dysarthric, language intact.  CN II-XII intact. Bulk and tone normal, muscle strength 5/5 throughout.  Sensation to light touch intact.  Deep tendon reflexes 2+ throughout.  Finger to nose testing intact.  Gait steady; Romberg negative.   , DO  CC: Shon Millet, MD

## 2021-11-04 ENCOUNTER — Ambulatory Visit: Payer: Medicaid Other | Admitting: Neurology

## 2021-11-04 ENCOUNTER — Encounter: Payer: Self-pay | Admitting: Neurology

## 2021-11-04 ENCOUNTER — Other Ambulatory Visit (HOSPITAL_COMMUNITY): Payer: Self-pay

## 2021-11-04 ENCOUNTER — Telehealth (HOSPITAL_COMMUNITY): Payer: Self-pay | Admitting: Pharmacy Technician

## 2021-11-04 VITALS — BP 120/63 | HR 97 | Ht 62.0 in | Wt 227.0 lb

## 2021-11-04 DIAGNOSIS — T50905A Adverse effect of unspecified drugs, medicaments and biological substances, initial encounter: Secondary | ICD-10-CM

## 2021-11-04 DIAGNOSIS — G43009 Migraine without aura, not intractable, without status migrainosus: Secondary | ICD-10-CM | POA: Diagnosis not present

## 2021-11-04 DIAGNOSIS — G253 Myoclonus: Secondary | ICD-10-CM

## 2021-11-04 MED ORDER — UBRELVY 100 MG PO TABS: 1.0000 | ORAL_TABLET | ORAL | 5 refills | Status: DC | PRN

## 2021-11-04 MED ORDER — AIMOVIG 140 MG/ML ~~LOC~~ SOAJ: SUBCUTANEOUS | 5 refills | Status: DC

## 2021-11-04 NOTE — Telephone Encounter (Signed)
Patient Advocate Encounter   Received notification that prior authorization for Ubrelvy 100MG  tablets is required.   PA submitted on 11/04/2021 Key 11/06/2021 Status is pending       [id number], CPhT Pharmacy Patient Advocate Specialist [facility] Patient Advocate Team Direct Number: [phone]  Fax: [phone]

## 2021-11-04 NOTE — Patient Instructions (Signed)
Refilled [facility] for Bernita Raisin too.   See about decreasing or even getting off gabapentin as it causes twitching Follow up one year or as needed.

## 2021-11-04 NOTE — Progress Notes (Signed)
Medication Samples have been provided to the patient.  Drug name: Bernita Raisin       Strength: 100mg         Qty: 2  LOT: [id number]  Exp.Date: 6/24/9/25  Dosing instructions: As needed for Migraine repeat after 2 hours.  Maximum 2 tablets in 24 hours.  The patient has been instructed regarding the correct time, dose, and frequency of taking this medication, including desired effects and most common side effects.   26/9/25 11:52 AM 11/04/2021

## 2021-11-10 ENCOUNTER — Telehealth (HOSPITAL_COMMUNITY): Payer: Self-pay | Admitting: Pharmacy Technician

## 2021-11-10 NOTE — Telephone Encounter (Signed)
Patient Advocate Encounter  Prior Authorization for Aimovig 140MG /ML auto-injectors has been approved.    PA# Effective dates: 11/10/2021 through 11/11/2022      11/13/2022, CPhT Pharmacy Patient Advocate Specialist [facility] Patient Advocate Team Direct Number: [phone]  Fax: [phone]

## 2021-11-10 NOTE — Telephone Encounter (Signed)
Patient Advocate Encounter   Received notification that prior authorization for Aimovig 140MG /ML auto-injectors is required.   PA submitted on 11/10/2021 Key [id number] Status is pending       11/12/2021, CPhT Pharmacy Patient Advocate Specialist [facility] Patient Advocate Team Direct Number: [phone]  Fax: [phone]

## 2021-12-01 ENCOUNTER — Other Ambulatory Visit (HOSPITAL_COMMUNITY): Payer: Self-pay

## 2021-12-01 NOTE — Telephone Encounter (Signed)
Patient Advocate Encounter  Received notification that the request for prior authorization for Ubrelvy 100MG  tablets  has been denied due to must try to 3 triptans .       , CPhT Pharmacy Patient Advocate Specialist [facility] Patient Advocate Team Direct Number: [phone]  Fax: [phone]

## 2021-12-06 NOTE — Telephone Encounter (Signed)
Tried calling patient, No answer.  Per Dr.Jaffe,  Please send prescription for eletriptan 40mg  - take one tablet as needed.  May repeat after 2 hours.  Maximum 2 tablets in 24 hours.  Refill 5. Let patient know that this is for migraine rescue and to let know if effective.

## 2021-12-16 ENCOUNTER — Other Ambulatory Visit (HOSPITAL_COMMUNITY): Payer: Self-pay

## 2021-12-16 ENCOUNTER — Telehealth (HOSPITAL_COMMUNITY): Payer: Self-pay | Admitting: Pharmacy Technician

## 2021-12-16 MED ORDER — ELETRIPTAN HYDROBROMIDE 40 MG PO TABS: 40.0000 mg | ORAL_TABLET | ORAL | 5 refills | Status: DC | PRN

## 2021-12-16 NOTE — Telephone Encounter (Signed)
Per Dr.Jaffe, Please send prescription for eletriptan 40mg  - take one tablet as needed.  May repeat after 2 hours.  Maximum 2 tablets in 24 hours.  Refill 5. Let patient know that this is for migraine rescue and to let know if effective.

## 2021-12-16 NOTE — Telephone Encounter (Signed)
Patient Advocate Encounter  Prior Authorization for Eletriptan Hydrobromide 40MG  tablets  has been approved.    PA# Effective dates: 12/16/2021 through 12/17/2022      12/19/2022, CPhT Pharmacy Patient Advocate Specialist [facility] Patient Advocate Team Direct Number: [phone]  Fax: [phone]

## 2021-12-16 NOTE — Telephone Encounter (Signed)
Patient Advocate Encounter   Received notification that prior authorization for Eletriptan Hydrobromide 40MG  tablets is required.   PA submitted on 12/16/2021 Key 12/18/2021 Status is pending       [id number], CPhT Pharmacy Patient Advocate Specialist [facility] Patient Advocate Team Direct Number: [phone]  Fax: [phone]

## 2022-03-22 ENCOUNTER — Other Ambulatory Visit (HOSPITAL_COMMUNITY): Payer: Self-pay

## 2022-03-22 ENCOUNTER — Telehealth: Payer: Self-pay | Admitting: Pharmacy Technician

## 2022-03-22 NOTE — Telephone Encounter (Signed)
Pharmacy Patient Advocate Encounter   Received notification from Cover My Meds that prior authorization for Bernita Raisin is required/requested.  Key [id number] archiving for now.  I see that this was denied previously & changed to eletriptan. I can't tell if the pt has taken the eletriptan though. Let us know if the pt has and it' isn't working. We can request the PA for Ubrelvy again. It was created on 11/10, so maybe the pharmacy just tried to fill it again?

## 2022-06-02 ENCOUNTER — Other Ambulatory Visit: Payer: Self-pay | Admitting: Neurology

## 2022-06-06 NOTE — Telephone Encounter (Signed)
Called and left a detailed message per DPR that we have filled [place] however patient will need to contact our office for a f/u for any future refills. Left our office contact so patient can call for f/u appt.

## 2022-06-13 ENCOUNTER — Ambulatory Visit: Payer: Medicaid Other

## 2022-06-16 ENCOUNTER — Ambulatory Visit: Payer: Medicaid Other

## 2022-06-21 ENCOUNTER — Encounter: Payer: Medicaid Other | Admitting: Physical Therapy

## 2022-06-22 NOTE — Progress Notes (Signed)
Sent message, via epic in basket, requesting orders in epic from surgeon.  

## 2022-06-23 ENCOUNTER — Ambulatory Visit: Payer: Medicaid Other | Admitting: Physical Therapy

## 2022-06-27 NOTE — Progress Notes (Signed)
Second request for pre op orders in [facility]: Left a voicemail with [employer].

## 2022-06-28 ENCOUNTER — Ambulatory Visit: Payer: Self-pay | Admitting: Physician Assistant

## 2022-06-28 DIAGNOSIS — G8929 Other chronic pain: Secondary | ICD-10-CM

## 2022-06-28 NOTE — Progress Notes (Signed)
Anesthesia Review:  PCP: Luciano Cutter LOV 05/25/22 Cardiologist : Chest x-ray : EKG : Echo : Stress test: Cardiac Cath :  Activity level:  Sleep Study/ CPAP : Fasting Blood Sugar :      / Checks Blood Sugar -- times a day:   Blood Thinner/ Instructions /Last Dose: ASA / Instructions/ Last Dose :    05/25/22- hgba1c- 5.6

## 2022-06-28 NOTE — H&P (View-Only) (Signed)
TOTAL KNEE ADMISSION H&P  Patient is being admitted for left total knee arthroplasty.  Subjective:  Chief Complaint:left knee pain.  HPI: Natalie Acevedo, 52 y.o. female, has a history of pain and functional disability in the left knee due to arthritis and has failed non-surgical conservative treatments for greater than 12 weeks to includeNSAID's and/or analgesics, corticosteriod injections, flexibility and strengthening excercises, weight reduction as appropriate, and activity modification.  Onset of symptoms was gradual, starting 8 years ago with gradually worsening course since that time. The patient noted no past surgery on the left knee(s).  Patient currently rates pain in the left knee(s) at 8 out of 10 with activity. Patient has night pain, worsening of pain with activity and weight bearing, pain that interferes with activities of daily living, pain with passive range of motion, crepitus, and joint swelling.  Patient has evidence of periarticular osteophytes and joint space narrowing by imaging studies.  There is no active infection.  Patient Active Problem List   Diagnosis Date Noted  . Mild intermittent asthma without complication 10/03/2018  . Moderate episode of recurrent major depressive disorder (HCC) 10/03/2018  . Essential hypertension 02/28/2018  . Muscle cramps 02/28/2018  . Obesity (BMI 35.0-39.9 without comorbidity) 02/28/2018   Past Medical History:  Diagnosis Date  . Asthma     Past Surgical History:  Procedure Laterality Date  . CERVICAL DISCECTOMY    . CESAREAN SECTION     X4    Current Outpatient Medications  Medication Sig Dispense Refill Last Dose  . AIMOVIG 140 MG/ML SOAJ INJECT 140MG EVERY 28 DAYS 1 mL 5   . albuterol (VENTOLIN HFA) 108 (90 Base) MCG/ACT inhaler Inhale 1-2 puffs into the lungs every 6 (six) hours as needed for wheezing or shortness of breath.     . Aspirin-Acetaminophen-Caffeine (GOODYS EXTRA STRENGTH) 500-325-65 MG PACK Take 1 packet by  mouth 2 (two) times daily as needed (headaches.).     . buPROPion (WELLBUTRIN SR) 100 MG 12 hr tablet Take 100 mg by mouth 2 (two) times daily.     . celecoxib (CELEBREX) 200 MG capsule Take 200 mg by mouth in the morning.     . eletriptan (RELPAX) 40 MG tablet Take 1 tablet (40 mg total) by mouth as needed for migraine or headache. May repeat in 2 hours if headache persists or recurs. (Patient not taking: Reported on 06/24/2022) 10 tablet 5   . escitalopram (LEXAPRO) 10 MG tablet Take 10 mg by mouth in the morning.     . hydrochlorothiazide (HYDRODIURIL) 25 MG tablet Take 25 mg by mouth in the morning.     . HYDROcodone-acetaminophen (NORCO) 10-325 MG tablet Take 1 tablet by mouth 4 (four) times daily as needed (pain.).     . losartan (COZAAR) 50 MG tablet TAKE ONE TABLET BY MOUTH TWICE DAILY     . QUEtiapine (SEROQUEL) 50 MG tablet Take 50 mg by mouth at bedtime.     . tiZANidine (ZANAFLEX) 4 MG tablet Take 4 mg by mouth every 8 (eight) hours as needed for muscle spasms.  1   . Ubrogepant (UBRELVY) 100 MG TABS Take 1 tablet by mouth as needed (May repeat after 2 hours.  Maximum 2 tablets in 24 hours.). (Patient not taking: Reported on 06/24/2022) 10 tablet 5    No current facility-administered medications for this visit.   Allergies  Allergen Reactions  . Zestril [Lisinopril] Cough    Social History   Tobacco Use  . Smoking status: Every   Day    Packs/day: 1.00    Types: Cigarettes  . Smokeless tobacco: Never  Substance Use Topics  . Alcohol use: Not Currently    Family History  Problem Relation Age of Onset  . Kidney disease Mother   . Cancer Maternal Grandmother      Review of Systems  Respiratory:  Positive for wheezing.   Musculoskeletal:  Positive for arthralgias.  All other systems reviewed and are negative.  Objective:  Physical Exam Constitutional:      General: She is not in acute distress.    Appearance: Normal appearance.  HENT:     Head: Normocephalic and  atraumatic.  Eyes:     Extraocular Movements: Extraocular movements intact.     Pupils: Pupils are equal, round, and reactive to light.  Cardiovascular:     Rate and Rhythm: Normal rate and regular rhythm.     Pulses: Normal pulses.     Heart sounds: Normal heart sounds. No murmur heard. Pulmonary:     Effort: Pulmonary effort is normal.     Breath sounds: Normal breath sounds.  Abdominal:     General: Abdomen is flat. Bowel sounds are normal. There is no distension.     Palpations: Abdomen is soft.     Tenderness: There is no abdominal tenderness.  Musculoskeletal:     Cervical back: Normal range of motion and neck supple.     Left knee: Swelling and bony tenderness present. No effusion or erythema. Normal range of motion. Tenderness present.  Lymphadenopathy:     Cervical: No cervical adenopathy.  Skin:    General: Skin is warm and dry.     Findings: No erythema or rash.  Neurological:     General: No focal deficit present.     Mental Status: She is alert and oriented to person, place, and time.  Psychiatric:        Mood and Affect: Mood normal.        Behavior: Behavior normal.   Vital signs in last 24 hours: @VSRANGES@  Labs:   Estimated body mass index is 41.52 kg/m as calculated from the following:   Height as of 11/04/21: 5' 2" (1.575 m).   Weight as of 11/04/21: 103 kg.   Imaging Review Plain radiographs demonstrate moderate degenerative joint disease of the left knee(s). The overall alignment ismild varus. The bone quality appears to be good for age and reported activity level.      Assessment/Plan:  End stage arthritis, left knee   The patient history, physical examination, clinical judgment of the provider and imaging studies are consistent with end stage degenerative joint disease of the left knee(s) and total knee arthroplasty is deemed medically necessary. The treatment options including medical management, injection therapy arthroscopy and  arthroplasty were discussed at length. The risks and benefits of total knee arthroplasty were presented and reviewed. The risks due to aseptic loosening, infection, stiffness, patella tracking problems, thromboembolic complications and other imponderables were discussed. The patient acknowledged the explanation, agreed to proceed with the plan and consent was signed. Patient is being admitted for inpatient treatment for surgery, pain control, PT, OT, prophylactic antibiotics, VTE prophylaxis, progressive ambulation and ADL's and discharge planning. The patient is planning to be discharged  home with oupt PT     Patient's anticipated LOS is less than 2 midnights, meeting these requirements: - Younger than 65 - Lives within 1 hour of care - Has a competent adult at home to recover with post-op recover -   NO history of  - Chronic pain requiring opiods  - Diabetes  - Coronary Artery Disease  - Heart failure  - Heart attack  - Stroke  - DVT/VTE  - Cardiac arrhythmia  - Respiratory Failure/COPD  - Renal failure  - Anemia  - Advanced Liver disease   

## 2022-06-28 NOTE — H&P (Signed)
TOTAL KNEE ADMISSION H&P  Patient is being admitted for left total knee arthroplasty.  Subjective:  Chief Complaint:left knee pain.  HPI: Natalie Acevedo, 52 y.o. female, has a history of pain and functional disability in the left knee due to arthritis and has failed non-surgical conservative treatments for greater than 12 weeks to includeNSAID's and/or analgesics, corticosteriod injections, flexibility and strengthening excercises, weight reduction as appropriate, and activity modification.  Onset of symptoms was gradual, starting 8 years ago with gradually worsening course since that time. The patient noted no past surgery on the left knee(s).  Patient currently rates pain in the left knee(s) at 8 out of 10 with activity. Patient has night pain, worsening of pain with activity and weight bearing, pain that interferes with activities of daily living, pain with passive range of motion, crepitus, and joint swelling.  Patient has evidence of periarticular osteophytes and joint space narrowing by imaging studies.  There is no active infection.  Patient Active Problem List   Diagnosis Date Noted  . Mild intermittent asthma without complication [id number]  . Moderate episode of recurrent major depressive disorder ([place]) 10/03/2018  . Essential hypertension 02/28/2018  . Muscle cramps 02/28/2018  . Obesity (BMI 35.0-39.9 without comorbidity) 02/28/2018   Past Medical History:  Diagnosis Date  . Asthma     Past Surgical History:  Procedure Laterality Date  . CERVICAL DISCECTOMY    . CESAREAN SECTION     X4    Current Outpatient Medications  Medication Sig Dispense Refill Last Dose  . AIMOVIG 140 MG/ML SOAJ INJECT '140MG'$  EVERY 28 DAYS 1 mL 5   . albuterol (VENTOLIN HFA) 108 (90 Base) MCG/ACT inhaler Inhale 1-2 puffs into the lungs every 6 (six) hours as needed for wheezing or shortness of breath.     . Aspirin-Acetaminophen-Caffeine (GOODYS EXTRA STRENGTH) [phone] MG PACK Take 1 packet by  mouth 2 (two) times daily as needed (headaches.).     Marland Kitchen buPROPion (WELLBUTRIN SR) 100 MG 12 hr tablet Take 100 mg by mouth 2 (two) times daily.     . celecoxib (CELEBREX) 200 MG capsule Take 200 mg by mouth in the morning.     . eletriptan (RELPAX) 40 MG tablet Take 1 tablet (40 mg total) by mouth as needed for migraine or headache. May repeat in 2 hours if headache persists or recurs. (Patient not taking: Reported on 06/24/2022) 10 tablet 5   . escitalopram (LEXAPRO) 10 MG tablet Take 10 mg by mouth in the morning.     . hydrochlorothiazide (HYDRODIURIL) 25 MG tablet Take 25 mg by mouth in the morning.     Marland Kitchen HYDROcodone-acetaminophen (NORCO) 10-325 MG tablet Take 1 tablet by mouth 4 (four) times daily as needed (pain.).     Marland Kitchen losartan (COZAAR) 50 MG tablet TAKE ONE TABLET BY MOUTH TWICE DAILY     . QUEtiapine (SEROQUEL) 50 MG tablet Take 50 mg by mouth at bedtime.     Marland Kitchen tiZANidine (ZANAFLEX) 4 MG tablet Take 4 mg by mouth every 8 (eight) hours as needed for muscle spasms.  1   . Ubrogepant (UBRELVY) 100 MG TABS Take 1 tablet by mouth as needed (May repeat after 2 hours.  Maximum 2 tablets in 24 hours.). (Patient not taking: Reported on 06/24/2022) 10 tablet 5    No current facility-administered medications for this visit.   Allergies  Allergen Reactions  . Zestril [Lisinopril] Cough    Social History   Tobacco Use  . Smoking status: Every  Day    Packs/day: 1.00    Types: Cigarettes  . Smokeless tobacco: Never  Substance Use Topics  . Alcohol use: Not Currently    Family History  Problem Relation Age of Onset  . Kidney disease Mother   . Cancer Maternal Grandmother      Review of Systems  Respiratory:  Positive for wheezing.   Musculoskeletal:  Positive for arthralgias.  All other systems reviewed and are negative.  Objective:  Physical Exam Constitutional:      General: She is not in acute distress.    Appearance: Normal appearance.  HENT:     Head: Normocephalic and  atraumatic.  Eyes:     Extraocular Movements: Extraocular movements intact.     Pupils: Pupils are equal, round, and reactive to light.  Cardiovascular:     Rate and Rhythm: Normal rate and regular rhythm.     Pulses: Normal pulses.     Heart sounds: Normal heart sounds. No murmur heard. Pulmonary:     Effort: Pulmonary effort is normal.     Breath sounds: Normal breath sounds.  Abdominal:     General: Abdomen is flat. Bowel sounds are normal. There is no distension.     Palpations: Abdomen is soft.     Tenderness: There is no abdominal tenderness.  Musculoskeletal:     Cervical back: Normal range of motion and neck supple.     Left knee: Swelling and bony tenderness present. No effusion or erythema. Normal range of motion. Tenderness present.  Lymphadenopathy:     Cervical: No cervical adenopathy.  Skin:    General: Skin is warm and dry.     Findings: No erythema or rash.  Neurological:     General: No focal deficit present.     Mental Status: She is alert and oriented to person, place, and time.  Psychiatric:        Mood and Affect: Mood normal.        Behavior: Behavior normal.   Vital signs in last 24 hours: '@VSRANGES'$ @  Labs:   Estimated body mass index is 41.52 kg/m as calculated from the following:   Height as of 11/04/21: '5\' 2"'$  (1.575 m).   Weight as of 11/04/21: 103 kg.   Imaging Review Plain radiographs demonstrate moderate degenerative joint disease of the left knee(s). The overall alignment ismild varus. The bone quality appears to be good for age and reported activity level.      Assessment/Plan:  End stage arthritis, left knee   The patient history, physical examination, clinical judgment of the provider and imaging studies are consistent with end stage degenerative joint disease of the left knee(s) and total knee arthroplasty is deemed medically necessary. The treatment options including medical management, injection therapy arthroscopy and  arthroplasty were discussed at length. The risks and benefits of total knee arthroplasty were presented and reviewed. The risks due to aseptic loosening, infection, stiffness, patella tracking problems, thromboembolic complications and other imponderables were discussed. The patient acknowledged the explanation, agreed to proceed with the plan and consent was signed. Patient is being admitted for inpatient treatment for surgery, pain control, PT, OT, prophylactic antibiotics, VTE prophylaxis, progressive ambulation and ADL's and discharge planning. The patient is planning to be discharged  home with oupt PT     Patient's anticipated LOS is less than 2 midnights, meeting these requirements: - Younger than 65 - Lives within 1 hour of care - Has a competent adult at home to recover with post-op recover -  NO history of  - Chronic pain requiring opiods  - Diabetes  - Coronary Artery Disease  - Heart failure  - Heart attack  - Stroke  - DVT/VTE  - Cardiac arrhythmia  - Respiratory Failure/COPD  - Renal failure  - Anemia  - Advanced Liver disease

## 2022-06-28 NOTE — Patient Instructions (Signed)
SURGICAL WAITING ROOM VISITATION  Patients having surgery or a procedure may have no more than 2 support people in the waiting area - these visitors may rotate.    Children under the age of 52 must have an adult with them who is not the patient.  Due to an increase in RSV and influenza rates and associated hospitalizations, children ages 30 and under may not visit patients in [place].  If the patient needs to stay at the hospital during part of their recovery, the visitor guidelines for inpatient rooms apply. Pre-op nurse will coordinate an appropriate time for 1 support person to accompany patient in pre-op.  This support person may not rotate.    Please refer to the [facility] website for the visitor guidelines for Inpatients (after your surgery is over and you are in a regular room).       Your procedure is scheduled on:  07/11/22    Report to [facility] Main Entrance    Report to admitting at   [phone]   Call this number if you have problems the morning of surgery [phone]   Do not eat food :After Midnight.   After Midnight you may have the following liquids until __ 0415____ AM DAY OF SURGERY  Water Non-Citrus Juices (without pulp, NO RED-Apple, White grape, White cranberry) Black Coffee (NO MILK/CREAM OR CREAMERS, sugar ok)  Clear Tea (NO MILK/CREAM OR CREAMERS, sugar ok) regular and decaf                             Plain Jell-O (NO RED)                                           Fruit ices (not with fruit pulp, NO RED)                                     Popsicles (NO RED)                                                               Sports drinks like Gatorade (NO RED)                   The day of surgery:  Drink ONE (1) Pre-Surgery Clear Ensure or G2 at  0415 AM ( have completed by )  the morning of surgery. Drink in one sitting. Do not sip.  This drink was given to you during your hospital  pre-op appointment visit. Nothing else to drink  after completing the  Pre-Surgery Clear Ensure or G2.          If you have questions, please contact your surgeon's office.       Oral Hygiene is also important to reduce your risk of infection.                                    Remember - BRUSH YOUR TEETH THE MORNING OF SURGERY WITH YOUR  REGULAR TOOTHPASTE  DENTURES WILL BE REMOVED PRIOR TO SURGERY PLEASE DO NOT APPLY "Poly grip" OR ADHESIVES!!!   Do NOT smoke after Midnight   Take these medicines the morning of surgery with A SIP OF WATER:  iinhalers as usual and bring, wellbutrin, lexapro   DO NOT TAKE ANY ORAL DIABETIC MEDICATIONS DAY OF YOUR SURGERY  Bring CPAP mask and tubing day of surgery.                              You may not have any metal on your body including hair pins, jewelry, and body piercing             Do not wear make-up, lotions, powders, perfumes/cologne, or deodorant  Do not wear nail polish including gel and S&S, artificial/acrylic nails, or any other type of covering on natural nails including finger and toenails. If you have artificial nails, gel coating, etc. that needs to be removed by a nail salon please have this removed prior to surgery or surgery may need to be canceled/ delayed if the surgeon/ anesthesia feels like they are unable to be safely monitored.   Do not shave  48 hours prior to surgery.               Men may shave face and neck.   Do not bring valuables to the hospital. [place].   Contacts, glasses, dentures or bridgework may not be worn into surgery.   Bring small overnight bag day of surgery.   DO NOT [place]. PHARMACY WILL DISPENSE MEDICATIONS LISTED ON YOUR MEDICATION LIST TO YOU DURING YOUR ADMISSION [place]!    Patients discharged on the day of surgery will not be allowed to drive home.  Someone NEEDS to stay with you for the first 24 hours after anesthesia.   Special  Instructions: Bring a copy of your healthcare power of attorney and living will documents the day of surgery if you haven't scanned them before.              Please read over the following fact sheets you were given: IF [place] [phone]   If you received a COVID test during your pre-op visit  it is requested that you wear a mask when out in public, stay away from anyone that may not be feeling well and notify your surgeon if you develop symptoms. If you test positive for Covid or have been in contact with anyone that has tested positive in the last 10 days please notify you surgeon.     - Preparing for Surgery Before surgery, you can play an important role.  Because skin is not sterile, your skin needs to be as free of germs as possible.  You can reduce the number of germs on your skin by washing with CHG (chlorahexidine gluconate) soap before surgery.  CHG is an antiseptic cleaner which kills germs and bonds with the skin to continue killing germs even after washing. Please DO NOT use if you have an allergy to CHG or antibacterial soaps.  If your skin becomes reddened/irritated stop using the CHG and inform your nurse when you arrive at Short Stay. Do not shave (including legs and underarms) for at least 48 hours prior  to the first CHG shower.  You may shave your face/neck. Please follow these instructions carefully:  1.  Shower with CHG Soap the night before surgery and the  morning of Surgery.  2.  If you choose to wash your hair, wash your hair first as usual with your  normal  shampoo.  3.  After you shampoo, rinse your hair and body thoroughly to remove the  shampoo.                           4.  Use CHG as you would any other liquid soap.  You can apply chg directly  to the skin and wash                       Gently with a scrungie or clean washcloth.  5.  Apply the CHG Soap to your body ONLY FROM THE NECK DOWN.   Do not use  on face/ open                           Wound or open sores. Avoid contact with eyes, ears mouth and genitals (private parts).                       Wash face,  Genitals (private parts) with your normal soap.             6.  Wash thoroughly, paying special attention to the area where your surgery  will be performed.  7.  Thoroughly rinse your body with warm water from the neck down.  8.  DO NOT shower/wash with your normal soap after using and rinsing off  the CHG Soap.                9.  Pat yourself dry with a clean towel.            10.  Wear clean pajamas.            11.  Place clean sheets on your bed the night of your first shower and do not  sleep with pets. Day of Surgery : Do not apply any lotions/deodorants the morning of surgery.  Please wear clean clothes to the hospital/surgery center.  FAILURE TO FOLLOW THESE INSTRUCTIONS MAY RESULT IN THE CANCELLATION OF YOUR SURGERY PATIENT SIGNATURE_________________________________  NURSE SIGNATURE__________________________________  ________________________________________________________________________

## 2022-06-29 ENCOUNTER — Other Ambulatory Visit: Payer: Self-pay

## 2022-06-29 ENCOUNTER — Encounter (HOSPITAL_COMMUNITY)
Admission: RE | Admit: 2022-06-29 | Discharge: 2022-06-29 | Disposition: A | Discharge status: Home / Self Care | Payer: Medicaid Other | Source: Ambulatory Visit | Attending: Orthopedic Surgery | Admitting: Orthopedic Surgery

## 2022-06-29 ENCOUNTER — Encounter (HOSPITAL_COMMUNITY): Payer: Self-pay

## 2022-06-29 VITALS — BP 137/85 | HR 79 | Temp 98.4°F | Resp 18 | Ht 62.0 in | Wt 205.2 lb

## 2022-06-29 DIAGNOSIS — G8928 Other chronic postprocedural pain: Secondary | ICD-10-CM | POA: Insufficient documentation

## 2022-06-29 DIAGNOSIS — Z01818 Encounter for other preprocedural examination: Secondary | ICD-10-CM | POA: Insufficient documentation

## 2022-06-29 DIAGNOSIS — M25562 Pain in left knee: Secondary | ICD-10-CM | POA: Diagnosis not present

## 2022-06-29 DIAGNOSIS — G8929 Other chronic pain: Secondary | ICD-10-CM

## 2022-06-29 HISTORY — DX: Depression, unspecified: F32.A

## 2022-06-29 HISTORY — DX: Unspecified osteoarthritis, unspecified site: M19.90

## 2022-06-29 HISTORY — DX: Anxiety disorder, unspecified: F41.9

## 2022-06-29 HISTORY — DX: Anemia, unspecified: D64.9

## 2022-06-29 HISTORY — DX: Essential (primary) hypertension: I10

## 2022-06-29 LAB — TYPE AND SCREEN
ABO/RH(D): A NEG
Antibody Screen: NEGATIVE

## 2022-06-29 LAB — SURGICAL PCR SCREEN
MRSA, PCR: NEGATIVE
Staphylococcus aureus: NEGATIVE

## 2022-06-29 LAB — CBC WITH DIFFERENTIAL/PLATELET
Abs Immature Granulocytes: 0.03 10*3/uL (ref 0.00–0.07)
Basophils Absolute: 0.1 10*3/uL (ref 0.0–0.1)
Basophils Relative: 1 %
Eosinophils Absolute: 0.2 10*3/uL (ref 0.0–0.5)
Eosinophils Relative: 2 %
HCT: 41.7 % (ref 36.0–46.0)
Hemoglobin: 11.7 g/dL — ABNORMAL LOW (ref 12.0–15.0)
Immature Granulocytes: 0 %
Lymphocytes Relative: 15 %
Lymphs Abs: 1.3 10*3/uL (ref 0.7–4.0)
MCH: 20.1 pg — ABNORMAL LOW (ref 26.0–34.0)
MCHC: 28.1 g/dL — ABNORMAL LOW (ref 30.0–36.0)
MCV: 71.8 fL — ABNORMAL LOW (ref 80.0–100.0)
Monocytes Absolute: 0.7 10*3/uL (ref 0.1–1.0)
Monocytes Relative: 8 %
Neutro Abs: 6.3 10*3/uL (ref 1.7–7.7)
Neutrophils Relative %: 74 %
Platelets: 365 10*3/uL (ref 150–400)
RBC: 5.81 MIL/uL — ABNORMAL HIGH (ref 3.87–5.11)
RDW: 22.8 % — ABNORMAL HIGH (ref 11.5–15.5)
WBC: 8.6 10*3/uL (ref 4.0–10.5)
nRBC: 0 % (ref 0.0–0.2)

## 2022-06-29 LAB — COMPREHENSIVE METABOLIC PANEL
ALT: 16 U/L (ref 0–44)
AST: 20 U/L (ref 15–41)
Albumin: 4.1 g/dL (ref 3.5–5.0)
Alkaline Phosphatase: 73 U/L (ref 38–126)
Anion gap: 11 (ref 5–15)
BUN: 11 mg/dL (ref 6–20)
CO2: 30 mmol/L (ref 22–32)
Calcium: 9.4 mg/dL (ref 8.9–10.3)
Chloride: 101 mmol/L (ref 98–111)
Creatinine, Ser: 0.76 mg/dL (ref 0.44–1.00)
GFR, Estimated: >60 mL/min (ref >60)
Glucose, Bld: 129 mg/dL — ABNORMAL HIGH (ref 70–99)
Potassium: 3.7 mmol/L (ref 3.5–5.1)
Sodium: 142 mmol/L (ref 135–145)
Total Bilirubin: 0.6 mg/dL (ref 0.3–1.2)
Total Protein: 7.1 g/dL (ref 6.5–8.1)

## 2022-07-10 NOTE — Anesthesia Preprocedure Evaluation (Signed)
Anesthesia Evaluation  Patient identified by MRN, date of birth, ID band Patient awake    Reviewed: Allergy & Precautions, NPO status , Patient's Chart, lab work & pertinent test results  Airway Mallampati: II  TM Distance: >3 FB Neck ROM: Full    Dental  (+) Dental Advisory Given, Edentulous Upper, Edentulous Lower   Pulmonary asthma , Current SmokerPatient did not abstain from smoking.    + wheezing      Cardiovascular hypertension, Pt. on medications Normal cardiovascular exam Rhythm:Regular Rate:Normal     Neuro/Psych  PSYCHIATRIC DISORDERS Anxiety Depression    negative neurological ROS     GI/Hepatic negative GI ROS, Neg liver ROS,,,  Endo/Other  negative endocrine ROS    Renal/GU negative Renal ROS     Musculoskeletal  (+) Arthritis , Osteoarthritis,    Abdominal   Peds  Hematology  (+) Blood dyscrasia, anemia Plt 365k    Anesthesia Other Findings   Reproductive/Obstetrics                             Anesthesia Physical Anesthesia Plan  ASA: 2  Anesthesia Plan: Spinal   Post-op Pain Management: Regional block*, Tylenol PO (pre-op)* and Toradol IV (intra-op)*   Induction: Intravenous  PONV Risk Score and Plan: 1 and TIVA, Treatment may vary due to age or medical condition, Midazolam, Dexamethasone and Ondansetron  Airway Management Planned: Natural Airway and Simple Face Mask  Additional Equipment:   Intra-op Plan:   Post-operative Plan:   Informed Consent: I have reviewed the patients History and Physical, chart, labs and discussed the procedure including the risks, benefits and alternatives for the proposed anesthesia with the patient or authorized representative who has indicated his/her understanding and acceptance.     Dental advisory given  Plan Discussed with: CRNA  Anesthesia Plan Comments: (Pre-op nebulizer treatment)       Anesthesia Quick  Evaluation

## 2022-07-11 ENCOUNTER — Ambulatory Visit (HOSPITAL_COMMUNITY): Payer: Medicaid Other | Admitting: Physician Assistant

## 2022-07-11 ENCOUNTER — Other Ambulatory Visit: Payer: Self-pay

## 2022-07-11 ENCOUNTER — Ambulatory Visit (HOSPITAL_COMMUNITY): Payer: Medicaid Other

## 2022-07-11 ENCOUNTER — Ambulatory Visit (HOSPITAL_COMMUNITY)
Admission: RE | Admit: 2022-07-11 | Discharge: 2022-07-11 | Disposition: A | Discharge status: Home / Self Care | Payer: Medicaid Other | Source: Ambulatory Visit | Attending: Orthopedic Surgery | Admitting: Orthopedic Surgery

## 2022-07-11 ENCOUNTER — Ambulatory Visit (HOSPITAL_BASED_OUTPATIENT_CLINIC_OR_DEPARTMENT_OTHER): Payer: Medicaid Other | Admitting: Anesthesiology

## 2022-07-11 ENCOUNTER — Encounter (HOSPITAL_COMMUNITY): Payer: Self-pay | Admitting: Orthopedic Surgery

## 2022-07-11 ENCOUNTER — Encounter (HOSPITAL_COMMUNITY)
Admission: RE | Disposition: A | Discharge status: Home / Self Care | Payer: Self-pay | Source: Ambulatory Visit | Attending: Orthopedic Surgery

## 2022-07-11 DIAGNOSIS — M1712 Unilateral primary osteoarthritis, left knee: Secondary | ICD-10-CM | POA: Insufficient documentation

## 2022-07-11 DIAGNOSIS — F17210 Nicotine dependence, cigarettes, uncomplicated: Secondary | ICD-10-CM | POA: Insufficient documentation

## 2022-07-11 DIAGNOSIS — I10 Essential (primary) hypertension: Secondary | ICD-10-CM | POA: Insufficient documentation

## 2022-07-11 DIAGNOSIS — F32A Depression, unspecified: Secondary | ICD-10-CM | POA: Insufficient documentation

## 2022-07-11 DIAGNOSIS — Z79899 Other long term (current) drug therapy: Secondary | ICD-10-CM | POA: Insufficient documentation

## 2022-07-11 DIAGNOSIS — F419 Anxiety disorder, unspecified: Secondary | ICD-10-CM | POA: Insufficient documentation

## 2022-07-11 DIAGNOSIS — J4520 Mild intermittent asthma, uncomplicated: Secondary | ICD-10-CM | POA: Diagnosis not present

## 2022-07-11 DIAGNOSIS — Z01818 Encounter for other preprocedural examination: Secondary | ICD-10-CM

## 2022-07-11 HISTORY — PX: TOTAL KNEE ARTHROPLASTY: SHX125

## 2022-07-11 LAB — ABO/RH: ABO/RH(D): A NEG

## 2022-07-11 SURGERY — ARTHROPLASTY, KNEE, TOTAL
Anesthesia: Spinal | Site: Knee | Laterality: Left

## 2022-07-11 MED ORDER — OXYCODONE HCL 5 MG PO TABS
5.0000 mg | ORAL_TABLET | ORAL | Status: DC | PRN
  Administered 2022-07-11: 10 mg via ORAL

## 2022-07-11 MED ORDER — IPRATROPIUM-ALBUTEROL 0.5-2.5 (3) MG/3ML IN SOLN
3.0000 mL | Freq: Once | RESPIRATORY_TRACT | Status: AC
  Administered 2022-07-11: 3 mL via RESPIRATORY_TRACT
  Filled 2022-07-11: qty 3

## 2022-07-11 MED ORDER — LACTATED RINGERS IV SOLN: INTRAVENOUS | Status: DC

## 2022-07-11 MED ORDER — PROPOFOL 500 MG/50ML IV EMUL
INTRAVENOUS | Status: AC
  Filled 2022-07-11: qty 50

## 2022-07-11 MED ORDER — ESCITALOPRAM OXALATE 10 MG PO TABS: 10.0000 mg | ORAL_TABLET | Freq: Every morning | ORAL | Status: DC

## 2022-07-11 MED ORDER — IPRATROPIUM-ALBUTEROL 0.5-2.5 (3) MG/3ML IN SOLN: 3.0000 mL | RESPIRATORY_TRACT | Status: DC

## 2022-07-11 MED ORDER — SODIUM CHLORIDE (PF) 0.9 % IJ SOLN
INTRAMUSCULAR | Status: DC | PRN
  Administered 2022-07-11: 60 mL

## 2022-07-11 MED ORDER — ORAL CARE MOUTH RINSE: 15.0000 mL | Freq: Once | OROMUCOSAL | Status: AC

## 2022-07-11 MED ORDER — TRANEXAMIC ACID-NACL 1000-0.7 MG/100ML-% IV SOLN
1000.0000 mg | INTRAVENOUS | Status: AC
  Administered 2022-07-11: 1000 mg via INTRAVENOUS
  Filled 2022-07-11: qty 100

## 2022-07-11 MED ORDER — LACTATED RINGERS IV BOLUS
250.0000 mL | Freq: Once | INTRAVENOUS | Status: AC
  Administered 2022-07-11: 250 mL via INTRAVENOUS

## 2022-07-11 MED ORDER — BUPIVACAINE LIPOSOME 1.3 % IJ SUSP
INTRAMUSCULAR | Status: DC | PRN
  Administered 2022-07-11: 20 mL

## 2022-07-11 MED ORDER — ROPIVACAINE HCL 5 MG/ML IJ SOLN
INTRAMUSCULAR | Status: DC | PRN
  Administered 2022-07-11: 20 mL via PERINEURAL

## 2022-07-11 MED ORDER — PROPOFOL 1000 MG/100ML IV EMUL
INTRAVENOUS | Status: AC
  Filled 2022-07-11: qty 100

## 2022-07-11 MED ORDER — ASPIRIN 81 MG PO TBEC: 81.0000 mg | DELAYED_RELEASE_TABLET | Freq: Two times a day (BID) | ORAL | 0 refills | Status: AC

## 2022-07-11 MED ORDER — CELECOXIB 200 MG PO CAPS: 200.0000 mg | ORAL_CAPSULE | Freq: Every morning | ORAL | Status: DC

## 2022-07-11 MED ORDER — LOSARTAN POTASSIUM 50 MG PO TABS: 50.0000 mg | ORAL_TABLET | Freq: Two times a day (BID) | ORAL | Status: DC

## 2022-07-11 MED ORDER — MIDAZOLAM HCL 5 MG/5ML IJ SOLN
INTRAMUSCULAR | Status: DC | PRN
  Administered 2022-07-11: 2 mg via INTRAVENOUS

## 2022-07-11 MED ORDER — ALBUTEROL SULFATE (2.5 MG/3ML) 0.083% IN NEBU: 3.0000 mL | INHALATION_SOLUTION | Freq: Four times a day (QID) | RESPIRATORY_TRACT | Status: DC | PRN

## 2022-07-11 MED ORDER — QUETIAPINE FUMARATE 50 MG PO TABS: 50.0000 mg | ORAL_TABLET | Freq: Every day | ORAL | Status: DC

## 2022-07-11 MED ORDER — BUPROPION HCL ER (SR) 100 MG PO TB12: 100.0000 mg | ORAL_TABLET | Freq: Two times a day (BID) | ORAL | Status: DC

## 2022-07-11 MED ORDER — METHOCARBAMOL 500 MG IVPB - SIMPLE MED: 500.0000 mg | Freq: Four times a day (QID) | INTRAVENOUS | Status: DC | PRN

## 2022-07-11 MED ORDER — ONDANSETRON HCL 4 MG PO TABS: 4.0000 mg | ORAL_TABLET | Freq: Three times a day (TID) | ORAL | 0 refills | Status: AC | PRN

## 2022-07-11 MED ORDER — MIDAZOLAM HCL 2 MG/2ML IJ SOLN
INTRAMUSCULAR | Status: AC
  Filled 2022-07-11: qty 2

## 2022-07-11 MED ORDER — SODIUM CHLORIDE (PF) 0.9 % IJ SOLN
INTRAMUSCULAR | Status: AC
  Filled 2022-07-11: qty 10

## 2022-07-11 MED ORDER — METHOCARBAMOL 500 MG PO TABS: 500.0000 mg | ORAL_TABLET | Freq: Four times a day (QID) | ORAL | Status: DC | PRN

## 2022-07-11 MED ORDER — PROMETHAZINE HCL 25 MG/ML IJ SOLN: 6.2500 mg | INTRAMUSCULAR | Status: DC | PRN

## 2022-07-11 MED ORDER — HYDROCHLOROTHIAZIDE 25 MG PO TABS: 25.0000 mg | ORAL_TABLET | Freq: Every morning | ORAL | Status: DC

## 2022-07-11 MED ORDER — BUPIVACAINE IN DEXTROSE 0.75-8.25 % IT SOLN
INTRATHECAL | Status: DC | PRN
  Administered 2022-07-11: 1.6 mL via INTRATHECAL

## 2022-07-11 MED ORDER — FENTANYL CITRATE (PF) 100 MCG/2ML IJ SOLN
INTRAMUSCULAR | Status: DC | PRN
  Administered 2022-07-11 (×2): 25 ug via INTRAVENOUS
  Administered 2022-07-11: 50 ug via INTRAVENOUS

## 2022-07-11 MED ORDER — POVIDONE-IODINE 10 % EX SWAB
2.0000 | Freq: Once | CUTANEOUS | Status: AC
  Administered 2022-07-11: 2 via TOPICAL

## 2022-07-11 MED ORDER — PROPOFOL 500 MG/50ML IV EMUL
INTRAVENOUS | Status: DC | PRN
  Administered 2022-07-11: 40 ug/kg/min via INTRAVENOUS

## 2022-07-11 MED ORDER — ACETAMINOPHEN 500 MG PO TABS
1000.0000 mg | ORAL_TABLET | Freq: Once | ORAL | Status: AC
  Administered 2022-07-11: 1000 mg via ORAL
  Filled 2022-07-11: qty 2

## 2022-07-11 MED ORDER — METHOCARBAMOL 500 MG PO TABS: 500.0000 mg | ORAL_TABLET | Freq: Three times a day (TID) | ORAL | 0 refills | Status: AC | PRN

## 2022-07-11 MED ORDER — ONDANSETRON HCL 4 MG/2ML IJ SOLN
INTRAMUSCULAR | Status: AC
  Filled 2022-07-11: qty 2

## 2022-07-11 MED ORDER — CEFAZOLIN SODIUM-DEXTROSE 2-4 GM/100ML-% IV SOLN
2.0000 g | INTRAVENOUS | Status: AC
  Administered 2022-07-11: 2 g via INTRAVENOUS
  Filled 2022-07-11: qty 100

## 2022-07-11 MED ORDER — OXYCODONE HCL 5 MG PO TABS
ORAL_TABLET | ORAL | Status: AC
  Filled 2022-07-11: qty 2

## 2022-07-11 MED ORDER — DEXAMETHASONE SODIUM PHOSPHATE 10 MG/ML IJ SOLN
INTRAMUSCULAR | Status: AC
  Filled 2022-07-11: qty 1

## 2022-07-11 MED ORDER — CEFAZOLIN SODIUM-DEXTROSE 2-4 GM/100ML-% IV SOLN: 2.0000 g | Freq: Four times a day (QID) | INTRAVENOUS | Status: DC

## 2022-07-11 MED ORDER — LACTATED RINGERS IV BOLUS: 250.0000 mL | Freq: Once | INTRAVENOUS | Status: DC

## 2022-07-11 MED ORDER — SODIUM CHLORIDE (PF) 0.9 % IJ SOLN
INTRAMUSCULAR | Status: AC
  Filled 2022-07-11: qty 50

## 2022-07-11 MED ORDER — SODIUM CHLORIDE 0.9 % IR SOLN
Status: DC | PRN
  Administered 2022-07-11: 3000 mL

## 2022-07-11 MED ORDER — ISOPROPYL ALCOHOL 70 % SOLN
Status: DC | PRN
  Administered 2022-07-11: 1 via TOPICAL

## 2022-07-11 MED ORDER — LIDOCAINE 2% (20 MG/ML) 5 ML SYRINGE
INTRAMUSCULAR | Status: DC | PRN
  Administered 2022-07-11: 60 mg via INTRAVENOUS

## 2022-07-11 MED ORDER — FENTANYL CITRATE PF 50 MCG/ML IJ SOSY: 25.0000 ug | PREFILLED_SYRINGE | INTRAMUSCULAR | Status: DC | PRN

## 2022-07-11 MED ORDER — FENTANYL CITRATE (PF) 100 MCG/2ML IJ SOLN
INTRAMUSCULAR | Status: AC
  Filled 2022-07-11: qty 2

## 2022-07-11 MED ORDER — CHLORHEXIDINE GLUCONATE 0.12 % MT SOLN
15.0000 mL | Freq: Once | OROMUCOSAL | Status: AC
  Administered 2022-07-11: 15 mL via OROMUCOSAL

## 2022-07-11 MED ORDER — BUPIVACAINE LIPOSOME 1.3 % IJ SUSP: 20.0000 mL | Freq: Once | INTRAMUSCULAR | Status: DC

## 2022-07-11 MED ORDER — SODIUM CHLORIDE 0.9 % IV SOLN: INTRAVENOUS | Status: DC

## 2022-07-11 MED ORDER — WATER FOR IRRIGATION, STERILE IR SOLN
Status: DC | PRN
  Administered 2022-07-11: 2000 mL

## 2022-07-11 MED ORDER — DEXAMETHASONE SODIUM PHOSPHATE 10 MG/ML IJ SOLN
INTRAMUSCULAR | Status: DC | PRN
  Administered 2022-07-11: 10 mg

## 2022-07-11 MED ORDER — DEXAMETHASONE SODIUM PHOSPHATE 10 MG/ML IJ SOLN
INTRAMUSCULAR | Status: DC | PRN
  Administered 2022-07-11: 8 mg via INTRAVENOUS

## 2022-07-11 MED ORDER — PHENYLEPHRINE HCL-NACL 20-0.9 MG/250ML-% IV SOLN
INTRAVENOUS | Status: DC | PRN
  Administered 2022-07-11: 15 ug/min via INTRAVENOUS

## 2022-07-11 MED ORDER — 0.9 % SODIUM CHLORIDE (POUR BTL) OPTIME
TOPICAL | Status: DC | PRN
  Administered 2022-07-11: 1000 mL

## 2022-07-11 MED ORDER — ONDANSETRON HCL 4 MG/2ML IJ SOLN
INTRAMUSCULAR | Status: DC | PRN
  Administered 2022-07-11: 4 mg via INTRAVENOUS

## 2022-07-11 MED ORDER — LACTATED RINGERS IV BOLUS
500.0000 mL | Freq: Once | INTRAVENOUS | Status: AC
  Administered 2022-07-11: 500 mL via INTRAVENOUS

## 2022-07-11 MED ORDER — ACETAMINOPHEN 500 MG PO TABS
ORAL_TABLET | ORAL | Status: AC
  Filled 2022-07-11: qty 2

## 2022-07-11 MED ORDER — BUPIVACAINE LIPOSOME 1.3 % IJ SUSP
INTRAMUSCULAR | Status: AC
  Filled 2022-07-11: qty 20

## 2022-07-11 SURGICAL SUPPLY — 65 items
ADH SKN CLS APL DERMABOND .7 (GAUZE/BANDAGES/DRESSINGS) ×1
APL PRP STRL LF DISP 70% ISPRP (MISCELLANEOUS) ×2
BAG COUNTER SPONGE SURGICOUNT (BAG) IMPLANT
BAG SPNG CNTER NS LX DISP (BAG) ×1
BLADE SAG 18X100X1.27 (BLADE) ×1 IMPLANT
BLADE SAW SAG 35X64 .89 (BLADE) ×1 IMPLANT
BNDG CMPR 5X3 CHSV STRCH STRL (GAUZE/BANDAGES/DRESSINGS) ×1
BNDG CMPR MED 10X6 ELC LF (GAUZE/BANDAGES/DRESSINGS) ×1
BNDG COHESIVE 3X5 TAN ST LF (GAUZE/BANDAGES/DRESSINGS) ×1 IMPLANT
BNDG ELASTIC 6X10 VLCR STRL LF (GAUZE/BANDAGES/DRESSINGS) ×1 IMPLANT
BOWL SMART MIX CTS (DISPOSABLE) ×1 IMPLANT
CEMENT BONE R 1X40 (Cement) IMPLANT
CEMENT BONE REFOBACIN R1X40 US (Cement) IMPLANT
CHLORAPREP W/TINT 26 (MISCELLANEOUS) ×2 IMPLANT
CLSR STERI-STRIP ANTIMIC 1/2X4 (GAUZE/BANDAGES/DRESSINGS) IMPLANT
COMP FEM PS KNEE NRW 6 LT (Joint) ×1 IMPLANT
COMPONENT FEM PS KNEE NRW 6 LT (Joint) IMPLANT
COVER SURGICAL LIGHT HANDLE (MISCELLANEOUS) ×1 IMPLANT
CUFF TOURN SGL QUICK 34 (TOURNIQUET CUFF) ×1
CUFF TRNQT CYL 34X4.125X (TOURNIQUET CUFF) ×1 IMPLANT
DERMABOND ADVANCED .7 DNX12 (GAUZE/BANDAGES/DRESSINGS) ×1 IMPLANT
DRAPE INCISE IOBAN 85X60 (DRAPES) ×1 IMPLANT
DRAPE SHEET LG 3/4 BI-LAMINATE (DRAPES) ×1 IMPLANT
DRAPE U-SHAPE 47X51 STRL (DRAPES) ×1 IMPLANT
DRESSING AQUACEL AG SP 3.5X10 (GAUZE/BANDAGES/DRESSINGS) ×1 IMPLANT
DRSG AQUACEL AG ADV 3.5X10 (GAUZE/BANDAGES/DRESSINGS) IMPLANT
DRSG AQUACEL AG SP 3.5X10 (GAUZE/BANDAGES/DRESSINGS) ×1
ELECT REM PT RETURN 15FT ADLT (MISCELLANEOUS) ×1 IMPLANT
GAUZE SPONGE 4X4 12PLY STRL (GAUZE/BANDAGES/DRESSINGS) ×1 IMPLANT
GLOVE BIO SURGEON STRL SZ 6.5 (GLOVE) ×2 IMPLANT
GLOVE BIOGEL PI IND STRL 6.5 (GLOVE) ×1 IMPLANT
GLOVE BIOGEL PI IND STRL 8 (GLOVE) ×1 IMPLANT
GLOVE SURG ORTHO 8.0 STRL STRW (GLOVE) ×2 IMPLANT
GOWN STRL REUS W/ TWL XL LVL3 (GOWN DISPOSABLE) ×2 IMPLANT
GOWN STRL REUS W/TWL XL LVL3 (GOWN DISPOSABLE) ×2
HANDPIECE INTERPULSE COAX TIP (DISPOSABLE) ×1
HDLS TROCR DRIL PIN KNEE 75 (PIN) ×1
HOLDER FOLEY CATH W/STRAP (MISCELLANEOUS) ×1 IMPLANT
HOOD PEEL AWAY T7 (MISCELLANEOUS) ×3 IMPLANT
IMPL PATELLA METAL SZ32X10 (Joint) IMPLANT
KIT TURNOVER KIT A (KITS) IMPLANT
LINER TIB ASF PS CD/6-7 11 LT (Liner) IMPLANT
MANIFOLD NEPTUNE II (INSTRUMENTS) ×1 IMPLANT
MARKER SKIN DUAL TIP RULER LAB (MISCELLANEOUS) ×1 IMPLANT
NS IRRIG 1000ML POUR BTL (IV SOLUTION) ×1 IMPLANT
PACK TOTAL KNEE CUSTOM (KITS) ×1 IMPLANT
PIN DRILL HDLS TROCAR 75 4PK (PIN) IMPLANT
SCREW HEADED 33MM KNEE (MISCELLANEOUS) IMPLANT
SET HNDPC FAN SPRY TIP SCT (DISPOSABLE) ×1 IMPLANT
SOLUTION IRRIG SURGIPHOR (IV SOLUTION) IMPLANT
SPIKE FLUID TRANSFER (MISCELLANEOUS) ×1 IMPLANT
STEM TIB PS KNEE D 0D LT (Stem) IMPLANT
STRIP CLOSURE SKIN 1/2X4 (GAUZE/BANDAGES/DRESSINGS) ×1 IMPLANT
SUT MNCRL AB 3-0 PS2 18 (SUTURE) ×1 IMPLANT
SUT STRATAFIX 0 PDS 27 VIOLET (SUTURE) ×1
SUT STRATAFIX PDO 1 14 VIOLET (SUTURE) ×1
SUT STRATFX PDO 1 14 VIOLET (SUTURE) ×1
SUT VIC AB 2-0 CT2 27 (SUTURE) ×2 IMPLANT
SUTURE STRATFX 0 PDS 27 VIOLET (SUTURE) ×1 IMPLANT
SUTURE STRATFX PDO 1 14 VIOLET (SUTURE) ×1 IMPLANT
SYR 50ML LL SCALE MARK (SYRINGE) ×1 IMPLANT
TRAY FOLEY MTR SLVR 14FR STAT (SET/KITS/TRAYS/PACK) IMPLANT
TUBE SUCTION HIGH CAP CLEAR NV (SUCTIONS) ×1 IMPLANT
UNDERPAD 30X36 HEAVY ABSORB (UNDERPADS AND DIAPERS) ×1 IMPLANT
WRAP KNEE MAXI GEL POST OP (GAUZE/BANDAGES/DRESSINGS) IMPLANT

## 2022-07-11 NOTE — Anesthesia Procedure Notes (Addendum)
Procedure Name: MAC Date/Time: 07/11/2022 7:18 AM  Performed by: Victoriano Lain, CRNAPre-anesthesia Checklist: Patient identified, Emergency Drugs available, Suction available, Patient being monitored and Timeout performed Oxygen Delivery Method: Simple face mask Placement Confirmation: positive ETCO2 Dental Injury: Teeth and Oropharynx as per pre-operative assessment

## 2022-07-11 NOTE — Evaluation (Signed)
Physical Therapy Evaluation Patient Details Name: Natalie Acevedo MRN: [id number] DOB: [birth year] Today's Date: 07/11/2022  History of Present Illness  52 yo female presents to therapy s/p L TKA on 07/11/2022 due to failure of conservative measures. Pt has PMH including but not limited to: asthma, depression, HTN, tobacco abuse and cervical discetomy.  Clinical Impression   Natalie Acevedo is a 52 y.o. female POD 0 s/p L TKA. Patient reports IND with mobility at baseline. Patient is now limited by functional impairments (see PT problem list below) and requires S for bed mobility, min guard and cues for transfers and gait with RW. Patient was able to ambulate 40 and 45 feet with RW and min guard and cues for safe walker management. Patient educated on safe sequencing for stair mobility with use of RW, car transfers with pt indicating she will sit in the backseat, pain management, use of CP and importance of completing HEP and verbalized understanding. No family present at eval. Patient instructed in exercises to facilitate ROM and circulation. Patient will benefit from continued skilled PT interventions to address impairments and progress towards PLOF. Patient has met mobility goals at adequate level for discharge home with family support and OPPT; will continue to follow if pt continues acute stay to progress towards Mod I goals. Pt O2 saturation remained < 76% on RA t/o eval, nurse and MD aware and no S or S of respiratory distress     Recommendations for follow up therapy are one component of a multi-disciplinary discharge planning process, led by the attending physician.  Recommendations may be updated based on patient status, additional functional criteria and insurance authorization.  Follow Up Recommendations Outpatient PT (start 3/20)      Assistance Recommended at Discharge Intermittent Supervision/Assistance  Patient can return home with the following  A little help with walking and/or  transfers;A little help with bathing/dressing/bathroom;Assistance with cooking/housework;Assist for transportation;Help with stairs or ramp for entrance    Equipment Recommendations Rolling walker (2 wheels) (provided at eval and adjsuted)  Recommendations for Other Services       Functional Status Assessment Patient has had a recent decline in their functional status and demonstrates the ability to make significant improvements in function in a reasonable and predictable amount of time.     Precautions / Restrictions Precautions Precautions: Knee;Fall Restrictions Weight Bearing Restrictions: No      Mobility  Bed Mobility Overal bed mobility: Needs Assistance Bed Mobility: Supine to Sit     Supine to sit: Supervision          Transfers Overall transfer level: Needs assistance Equipment used: Rolling walker (2 wheels) Transfers: Sit to/from Stand Sit to Stand: Min guard           General transfer comment: cues for safe and controlled movements, UE, LLE and AD placement with bed, commode and recliner transfers    Ambulation/Gait Ambulation/Gait assistance: Min guard Gait Distance (Feet): 45 Feet Assistive device: Rolling walker (2 wheels) Gait Pattern/deviations: Step-to pattern, Antalgic (absent L heel strike) Gait velocity: decreased     General Gait Details: B UE WB for gait  Stairs Stairs: Yes Stairs assistance: Min guard Stair Management: Two rails Number of Stairs: 3 General stair comments: cues for proper sequencing and technique, ed provided on use of RW to navigate steps at home  Wheelchair Mobility    Modified Rankin (Stroke Patients Only)       Balance Overall balance assessment: Needs assistance Sitting-balance support: Feet unsupported Sitting balance-Leahy  Scale: Good     Standing balance support: No upper extremity supported (static standing no UE support, dynamic reliant on RW) Standing balance-Leahy Scale: Fair                                Pertinent Vitals/Pain Pain Assessment Pain Assessment: 0-10 Pain Score: 5  Pain Location: L knee Pain Descriptors / Indicators: Constant, Aching, Operative site guarding Pain Intervention(s): Limited activity within patient's tolerance, Monitored during session, Premedicated before session, Ice applied    Home Living Family/patient expects to be discharged to:: Private residence Living Arrangements: Children;Other relatives Available Help at Discharge: Family Type of Home: House Home Access: Stairs to enter Entrance Stairs-Rails: None Entrance Stairs-Number of Steps: 2   Home Layout: One level Home Equipment: Crutches      Prior Function Prior Level of Function : Independent/Modified Independent             Mobility Comments: IND with ADLs, self care tasks, AIADLs and driving       Hand Dominance        Extremity/Trunk Assessment        Lower Extremity Assessment Lower Extremity Assessment: LLE deficits/detail LLE Deficits / Details: L ankle DF/PF 5/5, pt able to perform SLR with < 10 degree lag LLE Sensation: WNL       Communication   Communication: No difficulties  Cognition Arousal/Alertness: Awake/alert Behavior During Therapy: WFL for tasks assessed/performed Overall Cognitive Status: Within Functional Limits for tasks assessed                                          General Comments      Exercises Total Joint Exercises Ankle Circles/Pumps: AROM, Both, 20 reps Quad Sets: AROM, Left, 5 reps Heel Slides: AROM, Left, 5 reps Hip ABduction/ADduction: AROM, Left, 5 reps Straight Leg Raises: AROM, Left, 5 reps Long Arc Quad: AROM, Left, 5 reps   Assessment/Plan    PT Assessment Patient needs continued PT services  PT Problem List Decreased strength;Decreased range of motion;Decreased activity tolerance;Decreased balance;Decreased mobility;Decreased coordination;Decreased knowledge of use of  DME;Pain       PT Treatment Interventions DME instruction;Gait training;Stair training;Functional mobility training;Therapeutic activities;Therapeutic exercise;Balance training;Neuromuscular re-education;Patient/family education;Modalities    PT Goals (Current goals can be found in the Care Plan section)  Acute Rehab PT Goals Patient Stated Goal: sleep without pain PT Goal Formulation: With patient Time For Goal Achievement: 07/25/22 Potential to Achieve Goals: Good    Frequency       Co-evaluation               AM-PAC PT "6 Clicks" Mobility  Outcome Measure Help needed turning from your back to your side while in a flat bed without using bedrails?: None Help needed moving from lying on your back to sitting on the side of a flat bed without using bedrails?: None Help needed moving to and from a bed to a chair (including a wheelchair)?: A Little Help needed standing up from a chair using your arms (e.g., wheelchair or bedside chair)?: A Little Help needed to walk in hospital room?: A Little Help needed climbing 3-5 steps with a railing? : A Little 6 Click Score: 20    End of Session Equipment Utilized During Treatment: Gait belt Activity Tolerance: Patient tolerated treatment well;No increased pain Patient  left: in chair;with call bell/phone within reach;with nursing/sitter in room Nurse Communication: Mobility status;Other (comment);Patient requests pain meds (progression toward d/c and pain medication prior to d/c) PT Visit Diagnosis: Unsteadiness on feet (R26.81);Other abnormalities of gait and mobility (R26.89);Muscle weakness (generalized) (M62.81);Pain Pain - Right/Left: Left Pain - part of body: Knee    Time: 1216-1255 PT Time Calculation (min) (ACUTE ONLY): 39 min   Charges:   PT Evaluation $PT Eval Low Complexity: 1 Low PT Treatments $Gait Training: 8-22 mins $Therapeutic Exercise: 8-22 mins        Baird Lyons, PT   Adair Patter 07/11/2022, 1:08 PM

## 2022-07-11 NOTE — Anesthesia Procedure Notes (Signed)
Anesthesia Regional Block: Adductor canal block   Pre-Anesthetic Checklist: , timeout performed,  Correct Patient, Correct Site, Correct Laterality,  Correct Procedure, Correct Position, site marked,  Risks and benefits discussed,  Surgical consent,  Pre-op evaluation,  At surgeon's request and post-op pain management  Laterality: Left  Prep: chloraprep       Needles:  Injection technique: Single-shot  Needle Type: Echogenic Needle     Needle Length: 9cm  Needle Gauge: 21     Additional Needles:   Procedures:,,,, ultrasound used (permanent image in chart),,    Narrative:  Start time: 07/11/2022 6:50 AM End time: 07/11/2022 6:58 AM Injection made incrementally with aspirations every 5 mL.  Performed by: Personally  Anesthesiologist: Santa Lighter, MD  Additional Notes: No pain on injection. No increased resistance to injection. Injection made in 5cc increments.  Good needle visualization.  Patient tolerated procedure well.

## 2022-07-11 NOTE — Anesthesia Procedure Notes (Signed)
Spinal  Start time: 07/11/2022 7:21 AM End time: 07/11/2022 7:26 AM Reason for block: surgical anesthesia Staffing Performed: resident/CRNA  Resident/CRNA: Victoriano Lain, CRNA Performed by: Victoriano Lain, CRNA Authorized by: Santa Lighter, MD   Preanesthetic Checklist Completed: patient identified, IV checked, site marked, risks and benefits discussed, surgical consent, monitors and equipment checked, pre-op evaluation and timeout performed Spinal Block Patient position: sitting Prep: DuraPrep and site prepped and draped Patient monitoring: heart rate, cardiac monitor, continuous pulse ox and blood pressure Approach: midline Location: L3-4 Injection technique: single-shot Needle Needle type: Pencan  Needle gauge: 24 G Needle length: 10 cm Assessment Sensory level: T4 Events: CSF return Additional Notes Pt placed in sitting position for spinal placement. Spinal kit expiration date checked and verified. Sterile prep and drape of back. Site anesthetized with local. One attempt by CRNA. - heme. +clear free flowing CSF obtained. Pt tolerated procedure well. Placed supine.

## 2022-07-11 NOTE — Interval H&P Note (Signed)
The patient has been re-examined, and the chart reviewed, and there have been no interval changes to the documented history and physical.    Plan for L TKA for L knee OA. Patient reports she had markedly cut back on smoking.  The operative side was examined and the patient was confirmed to have sensation to DPN, SPN, TN intact, Motor EHL, ext, flex 5/5, and DP 2+, PT 2+, No significant edema.   The risks, benefits, and alternatives have been discussed at length with patient, and the patient is willing to proceed.  Left knee marked. Consent has been signed.

## 2022-07-11 NOTE — Anesthesia Postprocedure Evaluation (Signed)
Anesthesia Post Note  Patient: Natalie Acevedo  Procedure(s) Performed: TOTAL KNEE ARTHROPLASTY (Left: Knee)     Patient location during evaluation: PACU Anesthesia Type: Spinal Level of consciousness: awake, awake and alert and oriented Pain management: pain level controlled Vital Signs Assessment: post-procedure vital signs reviewed and stable Respiratory status: spontaneous breathing, nonlabored ventilation and respiratory function stable Cardiovascular status: blood pressure returned to baseline and stable Postop Assessment: no headache, no backache, spinal receding and no apparent nausea or vomiting Anesthetic complications: no Comments: Patient with intermittent low SpO2 reading in PACU.  Patient fully awake, alert, eating [employer] breakfast. Denies any CP, SOB, difficulty breathing. Discussed invasive testing (ABG, CXR) but speaking with patient believe this to be low yield.  Patient comfortable being discharged home. States she is at her baseline with no complaints.   No notable events documented.  Last Vitals:  Vitals:   07/11/22 1115 07/11/22 1130  BP: 106/83 120/74  Pulse: 77 99  Resp: 19 (!) 21  Temp:    SpO2: 96% (!) 62%    Last Pain:  Vitals:   07/11/22 1115  TempSrc:   PainSc: 0-No pain                 Santa Lighter

## 2022-07-11 NOTE — Progress Notes (Signed)
Orthopedic Tech Progress Note Patient Details:  Natalie Acevedo [birth year] [id number]  Ortho Devices Type of Ortho Device: Bone foam zero knee Ortho Device/Splint Interventions: Ordered      [place] [facility] 07/11/2022, 10:01 AM

## 2022-07-11 NOTE — Transfer of Care (Signed)
Immediate Anesthesia Transfer of Care Note  Patient: Natalie Acevedo  Procedure(s) Performed: TOTAL KNEE ARTHROPLASTY (Left: Knee)  Patient Location: PACU  Anesthesia Type:MAC  Level of Consciousness: awake and alert   Airway & Oxygen Therapy: Patient Spontanous Breathing and Patient connected to face mask oxygen  Post-op Assessment: Report given to RN and Post -op Vital signs reviewed and stable  Post vital signs: Reviewed and stable  Last Vitals:  Vitals Value Taken Time  BP 101/62 07/11/22 0945  Temp 37.3 C 07/11/22 0943  Pulse 62 07/11/22 0948  Resp 18 07/11/22 0948  SpO2 98 % 07/11/22 0948  Vitals shown include unvalidated device data.  Last Pain:  Vitals:   07/11/22 0943  TempSrc:   PainSc: Asleep         Complications: No notable events documented.

## 2022-07-11 NOTE — Op Note (Signed)
DATE OF SURGERY:  07/11/2022 TIME: 9:00 AM  PATIENT NAME:  Natalie Acevedo   AGE: 52 y.o.   PRE-OPERATIVE DIAGNOSIS: End-stage left knee osteoarthritis  POST-OPERATIVE DIAGNOSIS:  Same  PROCEDURE: Left total Knee Arthroplasty  SURGEON:  Makyia Erxleben A Tyree Fluharty, MD   ASSISTANT: Dorise Bullion, PA-C, present and scrubbed throughout the case, critical for assistance with exposure, retraction, instrumentation, and closure.   OPERATIVE IMPLANTS:  Press-fit Zimmer persona PPS size 6 narrow femur, size D tibial baseplate press-fit Osseo Ti, 11 mm MC polyethylene insert, 32 mm press-fit trabecular metal patella Implant Name Type Inv. Item Serial No. Manufacturer Lot No. LRB No. Used Action  STEM TIB PS KNEE D 0D LT - [id number] Stem STEM TIB PS KNEE D 0D LT  ZIMMER RECON(ORTH,TRAU,BIO,SG) [id number] Left 1 Implanted  IMPL PATELLA METAL [id number] - [id number] Joint IMPL PATELLA METAL [id number]  ZIMMER RECON(ORTH,TRAU,BIO,SG) [id number] Left 1 Implanted  PERSONA THE PERSONALIZED KNEE SYSTEM CRUCIATE RETAINING LEFT    ZIMMER [id number] Left 1 Implanted  LINER TIB ASF PS CD/6-7 11 LT - [id number] Liner LINER TIB ASF PS CD/6-7 11 LT  ZIMMER RECON(ORTH,TRAU,BIO,SG) [id number] Left 1 Implanted      PREOPERATIVE INDICATIONS:  Ala FAISA ROBERDS is a 52 y.o. year old female with end stage bone on bone degenerative arthritis of the knee who failed conservative treatment, including injections, antiinflammatories, activity modification, and assistive devices, and had significant impairment of their activities of daily living, and elected for Total Knee Arthroplasty.   The risks, benefits, and alternatives were discussed at length including but not limited to the risks of infection, bleeding, nerve injury, stiffness, blood clots, the need for revision surgery, cardiopulmonary complications, among others, and they were willing to proceed.  ESTIMATED BLOOD LOSS: 50cc  OPERATIVE DESCRIPTION:   Once adequate anesthesia  was induced, preoperative antibiotics, 2 gm of ancef,1 gm of Tranexamic Acid, and 8 mg of Decadron administered, the patient was positioned supine with a left thigh tourniquet placed.  The left lower extremity was prepped and draped in sterile fashion.  A time-  out was performed identifying the patient, planned procedure, and the appropriate extremity.     The leg was  exsanguinated, tourniquet elevated to 250 mmHg.  A midline incision was  made followed by median parapatellar arthrotomy. Anterior horn of the medial meniscus was released and resected. A medial release was performed, the infrapatellar fat pad was resected with care taken to protect the patellar tendon. The suprapatellar fat was removed to exposed the distal anterior femur. The anterior horn of the lateral meniscus and ACL were released.    Following initial  exposure, I first started with the femur  The femoral  canal was opened with a drill, canal was suctioned to try to prevent fat emboli.  An  intramedullary rod was passed set at 5 degrees valgus, 10 mm. The distal femur was resected.  Following this resection, the tibia was  subluxated anteriorly.  Using the extramedullary guide, 10 mm of bone was resected off   the proximal lateral tibia.  We confirmed the gap would be  stable medially and laterally with a size 9mm spacer block as well as confirmed that the tibial cut was perpendicular in the coronal plane, checking with an alignment rod.    Once this was done, the posterior femoral referencing femoral sizer was placed under to the posterior condyles with 3 degrees of external rotational which was parallel to the transepicondylar axis and perpendicular to [employer]. The femur  was sized to be a size 6 in the anterior-  posterior dimension. The  anterior, posterior, and  chamfer cuts were made without difficulty nor   notching making certain that I was along the anterior cortex to help  with flexion gap stability. Next a laminar  spreader was placed with the knee in flexion and the medial lateral menisci were resected.  5 cc of the Exparel mixture was injected in the medial side of the back of the knee and 3 cc in the lateral side.  1/2 inch curved osteotome was used to resect posterior osteophyte that was then removed with a pituitary rongeur.       At this point, the tibia was sized to be a size D.  The size D tray was  then pinned in position. Trial reduction was now carried with a 6 femur, D tibia, a 10 mm MC insert. Some laxity in both flexion and extension so elected for 45mm insert.  The knee had full extension and was stable to varus valgus stress in extension.  The knee was stable in flexion and the PCL was left  Attention was next directed to the patella.  Precut  measurement was noted to be 21 mm.  I resected down to 12 mm and used a  45mm patellar button to restore patellar height as well as cover the cut surface.     The patella lug holes were drilled and a 32 mm patella poly trial was placed.    The knee was brought to full extension with good flexion stability with the patella tracking through the trochlea without application of pressure.    Next the femoral component was again assessed and determined to be seated and appropriately lateralized.  The femoral lug holes were drilled.  The femoral component was then removed. Tibial component was again assessed and felt to be seated and appropriately rotated with the medial third of the tubercle. The tibia was then drilled, and keel punched.     Final components were  opened and impacted into place.   The knee was irrigated with sterile Betadine diluted in saline as well as pulse lavage normal saline. The synovial lining was  then injected a dilute Exparel.     I confirmed that I was satisfied with the range of motion and stability, and the final 11 mm MC poly insert was chosen.  It was placed into the knee.         The tourniquet had been let down at 58 minutes.   No significant hemostasis was required.  The medial parapatellar arthrotomy was then reapproximated using #1 Stratafix sutures with the knee  in flexion.  The remaining wound was closed with 0 stratafix, 2-0 Vicryl, and running 3-0 Monocryl. The knee was cleaned, dried, dressed sterilely using Dermabond and   Aquacel dressing.  The patient was then brought to recovery room in stable condition, tolerating the procedure  well. There were no complications.   Post op recs: WB: WBAT Abx: ancef Imaging: PACU xrays DVT prophylaxis: Aspirin 81mg  BID x4 weeks Follow up: 2 weeks after surgery for a wound check with Dr. Zachery Dakins at [facility].  Address: [address], [place], [place] [address]  Office Phone: [phone]  Charlies Constable, MD Orthopaedic Surgery

## 2022-07-11 NOTE — Discharge Instructions (Signed)
INSTRUCTIONS AFTER JOINT REPLACEMENT  ° °Remove items at home which could result in a fall. This includes throw rugs or furniture in walking pathways °ICE to the affected joint every three hours while awake for 30 minutes at a time, for at least the first 3-5 days, and then as needed for pain and swelling.  Continue to use ice for pain and swelling. You may notice swelling that will progress down to the foot and ankle.  This is normal after surgery.  Elevate your leg when you are not up walking on it.   °Continue to use the breathing machine you got in the hospital (incentive spirometer) which will help keep your temperature down.  It is common for your temperature to cycle up and down following surgery, especially at night when you are not up moving around and exerting yourself.  The breathing machine keeps your lungs expanded and your temperature down. ° ° °DIET:  As you were doing prior to hospitalization, we recommend a well-balanced diet. ° °DRESSING / WOUND CARE / SHOWERING ° °Keep the surgical dressing until follow up.  The dressing is water proof, so you can shower without any extra covering.  IF THE DRESSING FALLS OFF or the wound gets wet inside, change the dressing with sterile gauze.  Please use good hand washing techniques before changing the dressing.  Do not use any lotions or creams on the incision until instructed by your surgeon.   ° °ACTIVITY ° °Increase activity slowly as tolerated, but follow the weight bearing instructions below.   °No driving for 6 weeks or until further direction given by your physician.  You cannot drive while taking narcotics.  °No lifting or carrying greater than 10 lbs. until further directed by your surgeon. °Avoid periods of inactivity such as sitting longer than an hour when not asleep. This helps prevent blood clots.  °You may return to work once you are authorized by your doctor.  ° ° ° °WEIGHT BEARING  ° °Weight bearing as tolerated with assist device (walker, cane,  etc) as directed, use it as long as suggested by your surgeon or therapist, typically at least 4-6 weeks. ° ° °EXERCISES ° °Results after joint replacement surgery are often greatly improved when you follow the exercise, range of motion and muscle strengthening exercises prescribed by your doctor. Safety measures are also important to protect the joint from further injury. Any time any of these exercises cause you to have increased pain or swelling, decrease what you are doing until you are comfortable again and then slowly increase them. If you have problems or questions, call your caregiver or physical therapist for advice.  ° °Rehabilitation is important following a joint replacement. After just a few days of immobilization, the muscles of the leg can become weakened and shrink (atrophy).  These exercises are designed to build up the tone and strength of the thigh and leg muscles and to improve motion. Often times heat used for twenty to thirty minutes before working out will loosen up your tissues and help with improving the range of motion but do not use heat for the first two weeks following surgery (sometimes heat can increase post-operative swelling).  ° °These exercises can be done on a training (exercise) mat, on the floor, on a table or on a bed. Use whatever works the best and is most comfortable for you.    Use music or television while you are exercising so that the exercises are a pleasant break in your   day. This will make your life better with the exercises acting as a break in your routine that you can look forward to.   Perform all exercises about fifteen times, three times per day or as directed.  You should exercise both the operative leg and the other leg as well. ° °Exercises include: °  °Quad Sets - Tighten up the muscle on the front of the thigh (Quad) and hold for 5-10 seconds.   °Straight Leg Raises - With your knee straight (if you were given a brace, keep it on), lift the leg to 60  degrees, hold for 3 seconds, and slowly lower the leg.  Perform this exercise against resistance later as your leg gets stronger.  °Leg Slides: Lying on your back, slowly slide your foot toward your buttocks, bending your knee up off the floor (only go as far as is comfortable). Then slowly slide your foot back down until your leg is flat on the floor again.  °Angel Wings: Lying on your back spread your legs to the side as far apart as you can without causing discomfort.  °Hamstring Strength:  Lying on your back, push your heel against the floor with your leg straight by tightening up the muscles of your buttocks.  Repeat, but this time bend your knee to a comfortable angle, and push your heel against the floor.  You may put a pillow under the heel to make it more comfortable if necessary.  ° °A rehabilitation program following joint replacement surgery can speed recovery and prevent re-injury in the future due to weakened muscles. Contact your doctor or a physical therapist for more information on knee rehabilitation.  ° ° °CONSTIPATION ° °Constipation is defined medically as fewer than three stools per week and severe constipation as less than one stool per week.  Even if you have a regular bowel pattern at home, your normal regimen is likely to be disrupted due to multiple reasons following surgery.  Combination of anesthesia, postoperative narcotics, change in appetite and fluid intake all can affect your bowels.  ° °YOU MUST use at least one of the following options; they are listed in order of increasing strength to get the job done.  They are all available over the counter, and you may need to use some, POSSIBLY even all of these options:   ° °Drink plenty of fluids (prune juice may be helpful) and high fiber foods °Colace 100 mg by mouth twice a day  °Senokot for constipation as directed and as needed Dulcolax (bisacodyl), take with full glass of water  °Miralax (polyethylene glycol) once or twice a day as  needed. ° °If you have tried all these things and are unable to have a bowel movement in the first 3-4 days after surgery call either your surgeon or your primary doctor.   ° °If you experience loose stools or diarrhea, hold the medications until you stool forms back up.  If your symptoms do not get better within 1 week or if they get worse, check with your doctor.  If you experience "the worst abdominal pain ever" or develop nausea or vomiting, please contact the office immediately for further recommendations for treatment. ° ° °ITCHING:  If you experience itching with your medications, try taking only a single pain pill, or even half a pain pill at a time.  You can also use Benadryl over the counter for itching or also to help with sleep.  ° °TED HOSE STOCKINGS:  Use stockings on both   legs until for at least 2 weeks or as directed by physician office. They may be removed at night for sleeping. ° °MEDICATIONS:  See your medication summary on the “After Visit Summary” that nursing will review with you.  You may have some home medications which will be placed on hold until you complete the course of blood thinner medication.  It is important for you to complete the blood thinner medication as prescribed. ° ° °Blood clot prevention (DVT Prophylaxis): After surgery you are at an increased risk for a blood clot. you were prescribed a blood thinner, Aspirin 81mg, to be taken twice daily for a total of 4 weeks from surgery to help reduce your risk of getting a blood clot. This will help prevent a blood clot. Signs of a pulmonary embolus (blood clot in the lungs) include sudden short of breath, feeling lightheaded or dizzy, chest pain with a deep breath, rapid pulse rapid breathing. Signs of a blood clot in your arms or legs include new unexplained swelling and cramping, warm, red or darkened skin around the painful area. Please call the office or 911 right away if these signs or symptoms develop. ° °PRECAUTIONS:  If you  experience chest pain or shortness of breath - call 911 immediately for transfer to the hospital emergency department.  ° °If you develop a fever greater that 101 F, purulent drainage from wound, increased redness or drainage from wound, foul odor from the wound/dressing, or calf pain - CONTACT YOUR SURGEON.   °                                                °FOLLOW-UP APPOINTMENTS:  If you do not already have a post-op appointment, please call the office for an appointment to be seen by your surgeon.  Guidelines for how soon to be seen are listed in your “After Visit Summary”, but are typically between 2-3 weeks after surgery. ° °OTHER INSTRUCTIONS:  ° °Knee Replacement:  Do not place pillow under knee, focus on keeping the knee straight while resting. CPM instructions: 0-90 degrees, 2 hours in the morning, 2 hours in the afternoon, and 2 hours in the evening. Place foam block, curve side up under heel at all times except when in CPM or when walking.  DO NOT modify, tear, cut, or change the foam block in any way. ° °POST-OPERATIVE OPIOID TAPER INSTRUCTIONS: °It is important to wean off of your opioid medication as soon as possible. If you do not need pain medication after your surgery it is ok to stop day one. °Opioids include: °Codeine, Hydrocodone(Norco, Vicodin), Oxycodone(Percocet, oxycontin) and hydromorphone amongst others.  °Long term and even short term use of opiods can cause: °Increased pain response °Dependence °Constipation °Depression °Respiratory depression °And more.  °Withdrawal symptoms can include °Flu like symptoms °Nausea, vomiting °And more °Techniques to manage these symptoms °Hydrate well °Eat regular healthy meals °Stay active °Use relaxation techniques(deep breathing, meditating, yoga) °Do Not substitute Alcohol to help with tapering °If you have been on opioids for less than two weeks and do not have pain than it is ok to stop all together.  °Plan to wean off of opioids °This plan should  start within one week post op of your joint replacement. °Maintain the same interval or time between taking each dose and first decrease the dose.  °Cut the   total daily intake of opioids by one tablet each day °Next start to increase the time between doses. °The last dose that should be eliminated is the evening dose.  ° °MAKE SURE YOU:  °Understand these instructions.  °Get help right away if you are not doing well or get worse.  ° ° °Thank you for letting us be a part of your medical care team.  It is a privilege we respect greatly.  We hope these instructions will help you stay on track for a fast and full recovery!  ° ° ° °  °  °

## 2022-07-11 NOTE — Progress Notes (Signed)
Patient has been in recovery with low O2 sats into the 70s but has been completely asymptomatic. She even cleared PT without any SOB or concerns. Patient eager to discharge home. She did get a nebulizer in preop and PACU for allergies. Also has hx of smoking although has cut back significantly. Discussed with Dr. Gifford Shave, who agrees no need for further intervention at this point. Patient agrees with the plan.

## 2022-07-12 ENCOUNTER — Encounter (HOSPITAL_COMMUNITY): Payer: Self-pay | Admitting: Orthopedic Surgery

## 2022-07-13 ENCOUNTER — Ambulatory Visit: Payer: Medicaid Other | Attending: Orthopedic Surgery

## 2022-07-13 ENCOUNTER — Other Ambulatory Visit: Payer: Self-pay

## 2022-07-13 ENCOUNTER — Ambulatory Visit: Payer: Medicaid Other

## 2022-07-13 DIAGNOSIS — M25562 Pain in left knee: Secondary | ICD-10-CM | POA: Diagnosis not present

## 2022-07-13 DIAGNOSIS — M25662 Stiffness of left knee, not elsewhere classified: Secondary | ICD-10-CM | POA: Diagnosis present

## 2022-07-13 NOTE — Therapy (Signed)
OUTPATIENT PHYSICAL THERAPY LOWER EXTREMITY EVALUATION   Patient Name: Natalie Acevedo MRN: [id number] DOB:Feb 26, 1971, 52 y.o., female Today's Date: 07/13/2022  END OF SESSION:  PT End of Session - 07/13/22 1316     Visit Number 1    Number of Visits 8    Date for PT Re-Evaluation 09/23/22    PT Start Time 1301    PT Stop Time 1333    PT Time Calculation (min) 32 min    Activity Tolerance Patient tolerated treatment well    Behavior During Therapy WFL for tasks assessed/performed             Past Medical History:  Diagnosis Date   Anemia    Anxiety    Arthritis    Asthma    Depression    Hypertension    Past Surgical History:  Procedure Laterality Date   CERVICAL DISCECTOMY     CESAREAN SECTION     X4   TOTAL KNEE ARTHROPLASTY Left 07/11/2022   Procedure: TOTAL KNEE ARTHROPLASTY;  Surgeon: Willaim Sheng, MD;  Location: [facility];  Service: Orthopedics;  Laterality: Left;   Patient Active Problem List   Diagnosis Date Noted   Mild intermittent asthma without complication 123456   Moderate episode of recurrent major depressive disorder ([place]) 10/03/2018   Essential hypertension 02/28/2018   Muscle cramps 02/28/2018   Obesity (BMI 35.0-39.9 without comorbidity) 02/28/2018   REFERRING PROVIDER: Willaim Sheng, MD   REFERRING DIAG: POST OP LEFT TOTAL KNEE  REPLACEMENT 07/11/22   THERAPY DIAG:  Acute pain of left knee  Stiffness of left knee, not elsewhere classified  Rationale for Evaluation and Treatment: Rehabilitation  ONSET DATE: 07/11/22  SUBJECTIVE:   SUBJECTIVE STATEMENT: Patient reports that she had a left total knee replacement on 07/11/22. She has been using a crutch to get around since surgery as she cannot get around with her walker. She has even been walking around without an assistive device in her house. She has also been walking up and down her hill at home without any assistance. She had tried putting ice on her knee last night  and it is "not my friend." She does not get much as she does not leave home if she does not have to. She has noticed that walking now is not as bad as it was prior to surgery. She has not been doing any of the exercises that she was given while she was in the hospital, but she has been using her compression stocking and pillow for knee extension.   PERTINENT HISTORY: Hypertension, asthma, depression, obesity, current smoker, anxiety, and arthritis PAIN:  Are you having pain? Yes: NPRS scale: 75/10 Pain location: left knee Pain description: hard ache Aggravating factors: ice,  Relieving factors: medication  PRECAUTIONS: None  WEIGHT BEARING RESTRICTIONS: No  FALLS:  Has patient fallen in last 6 months? No  LIVING ENVIRONMENT: Lives with: lives with their family Lives in: Mobile home Stairs: No Has following equipment at home: Crutches  OCCUPATION: not working  PLOF: Independent  PATIENT GOALS: walk without a crutch   NEXT MD VISIT: 08/11/22  OBJECTIVE:   COGNITION: Overall cognitive status: Within functional limits for tasks assessed     SENSATION: Patient reports no numbness or tingling  OBSERVATION:  No significant bruising or signs and symptoms of DVT   PALPATION: No reported tenderness to palpation around left leg  LOWER EXTREMITY ROM:  Active ROM Right eval Left eval  Hip flexion  Hip extension    Hip abduction    Hip adduction    Hip internal rotation    Hip external rotation    Knee flexion 120 98  Knee extension 5 14  Ankle dorsiflexion    Ankle plantarflexion    Ankle inversion    Ankle eversion     (Blank rows = not tested)  LOWER EXTREMITY MMT: not tested due to surgical condition  GAIT: Assistive device utilized: Crutches; 1 axillary crutch Level of assistance: Modified independence Comments: step to pattern with decreased stance time on LLE and minimal knee flexion in swing phase   TODAY'S TREATMENT:                                                                                                                               DATE:                                     3/20 EXERCISE LOG  Exercise Repetitions and Resistance Comments  Supine heel slide  20 reps    Quad set  10 reps w/ 5 second hold    SLR 5 reps            Blank cell = exercise not performed today   PATIENT EDUCATION:  Education details: Plan of care, healing, HEP, and goals for therapy Person educated: Patient Education method: Explanation Education comprehension: verbalized understanding  HOME EXERCISE PROGRAM: Today's interventions were added to her home exercise program and she was recommended to complete these at least twice a day.  She reported understanding.  ASSESSMENT:  CLINICAL IMPRESSION: Patient is a 52 y.o. female who was seen today for physical therapy evaluation and treatment for following a left total knee replacement on 07/11/22.  She presented with moderate to high pain severity and moderate pain irritability with left knee flexion being the most aggravating to her familiar symptoms.  She exhibited no signs or symptoms of a DVT or any other complications.  She was provided an updated home exercise program which she was able to properly demonstrate and she felt comfortable with performing these interventions at home.  Recommend that she continue with skilled physical therapy to address her impairments to maximize her functional mobility.  OBJECTIVE IMPAIRMENTS: Abnormal gait, decreased mobility, difficulty walking, decreased ROM, decreased strength, hypomobility, increased edema, and pain.   ACTIVITY LIMITATIONS: standing, stairs, transfers, and locomotion level  PARTICIPATION LIMITATIONS: shopping and community activity  PERSONAL FACTORS: 3+ comorbidities: Hypertension, asthma, depression, obesity, current smoker, anxiety, and arthritis  are also affecting patient's functional outcome.   REHAB POTENTIAL: Good  CLINICAL DECISION  MAKING: Stable/uncomplicated  EVALUATION COMPLEXITY: Low   GOALS: Goals reviewed with patient? Yes  LONG TERM GOALS: Target date: 08/10/22  Patient will be independent with her HEP. Baseline:  Goal status: INITIAL  2.  Patient will be able to demonstrate at  least 120 degrees of left knee flexion for improved function descending stairs. Baseline:  Goal status: INITIAL  3.  Patient will be able to demonstrate active left knee extension within 5 degrees of neutral for improved gait mechanics. Baseline:  Goal status: INITIAL  4.  Patient will be able to ambulate without an assistive device with no significant gait deviations. Baseline:  Goal status: INITIAL  PLAN:  PT FREQUENCY: 1-2x/week  PT DURATION: 4 weeks  PLANNED INTERVENTIONS: Therapeutic exercises, Therapeutic activity, Neuromuscular re-education, Balance training, Gait training, Patient/Family education, Self Care, Joint mobilization, Stair training, Electrical stimulation, Cryotherapy, Moist heat, Vasopneumatic device, Manual therapy, and Re-evaluation  PLAN FOR NEXT SESSION: NuStep, bedside, oral quads, balance interventions, gait training, and modalities as needed   Darlin Coco, PT 07/13/2022, 3:43 PM

## 2022-07-15 ENCOUNTER — Encounter: Payer: Medicaid Other | Admitting: Physical Therapy

## 2022-07-15 ENCOUNTER — Encounter: Payer: Medicaid Other | Admitting: *Deleted

## 2022-07-18 ENCOUNTER — Encounter: Payer: Medicaid Other | Admitting: Physical Therapy

## 2022-07-18 ENCOUNTER — Ambulatory Visit: Payer: Medicaid Other

## 2022-07-18 DIAGNOSIS — M25562 Pain in left knee: Secondary | ICD-10-CM

## 2022-07-18 DIAGNOSIS — M25662 Stiffness of left knee, not elsewhere classified: Secondary | ICD-10-CM

## 2022-07-18 NOTE — Therapy (Signed)
OUTPATIENT PHYSICAL THERAPY LOWER EXTREMITY EVALUATION   Patient Name: Natalie Acevedo MRN: [id number] DOB:[birth year], 52 y.o., female Today's Date: 07/18/2022  END OF SESSION:  PT End of Session - 07/18/22 1315     Visit Number 2    Number of Visits 8    Date for PT Re-Evaluation 09/23/22    PT Start Time 1308    Activity Tolerance Patient tolerated treatment well    Behavior During Therapy [facility] for tasks assessed/performed             Past Medical History:  Diagnosis Date   Anemia    Anxiety    Arthritis    Asthma    Depression    Hypertension    Past Surgical History:  Procedure Laterality Date   CERVICAL DISCECTOMY     CESAREAN SECTION     X4   TOTAL KNEE ARTHROPLASTY Left 07/11/2022   Procedure: TOTAL KNEE ARTHROPLASTY;  Surgeon: Willaim Sheng, MD;  Location: [facility];  Service: Orthopedics;  Laterality: Left;   Patient Active Problem List   Diagnosis Date Noted   Mild intermittent asthma without complication 123456   Moderate episode of recurrent major depressive disorder ([place]) 10/03/2018   Essential hypertension 02/28/2018   Muscle cramps 02/28/2018   Obesity (BMI 35.0-39.9 without comorbidity) 02/28/2018   REFERRING PROVIDER: Willaim Sheng, MD   REFERRING DIAG: POST OP LEFT TOTAL KNEE  REPLACEMENT 07/11/22   THERAPY DIAG:  Acute pain of left knee  Stiffness of left knee, not elsewhere classified  Rationale for Evaluation and Treatment: Rehabilitation  ONSET DATE: 07/11/22  SUBJECTIVE:   SUBJECTIVE STATEMENT: Pt arrives for today's treatment session reporting 7/10 left knee pain.    PERTINENT HISTORY: Hypertension, asthma, depression, obesity, current smoker, anxiety, and arthritis PAIN:  Are you having pain? Yes: NPRS scale: 7/10 Pain location: left knee Pain description: hard ache Aggravating factors: ice,  Relieving factors: medication  PRECAUTIONS: None  WEIGHT BEARING RESTRICTIONS: No  FALLS:  Has patient fallen  in last 6 months? No  LIVING ENVIRONMENT: Lives with: lives with their family Lives in: Mobile home Stairs: No Has following equipment at home: Crutches  OCCUPATION: not working  PLOF: Independent  PATIENT GOALS: walk without a crutch   NEXT MD VISIT: 08/11/22  OBJECTIVE:   COGNITION: Overall cognitive status: Within functional limits for tasks assessed     SENSATION: Patient reports no numbness or tingling  OBSERVATION:  No significant bruising or signs and symptoms of DVT   PALPATION: No reported tenderness to palpation around left leg  LOWER EXTREMITY ROM:  Active ROM Right eval Left eval  Hip flexion    Hip extension    Hip abduction    Hip adduction    Hip internal rotation    Hip external rotation    Knee flexion 120 98  Knee extension 5 14  Ankle dorsiflexion    Ankle plantarflexion    Ankle inversion    Ankle eversion     (Blank rows = not tested)  LOWER EXTREMITY MMT: not tested due to surgical condition  GAIT: Assistive device utilized: Crutches; 1 axillary crutch Level of assistance: Modified independence Comments: step to pattern with decreased stance time on LLE and minimal knee flexion in swing phase   TODAY'S TREATMENT:  DATE:                                     3/25 EXERCISE LOG  Exercise Repetitions and Resistance Comments  Nustep Lvl 2 x 15 mins; seat 6-5   Heel/Toe Raises X20 reps   Rockerboard X2 mins   Supine heel slide  20 reps    Quad set  10 reps w/ 5 second hold    SLR 5 reps   LAQ X20 reps   Seated Marches  X20 reps    Blank cell = exercise not performed today   Modalities  Date:  Unattended Estim: Knee, IFC 80-150 Hz, 15 mins, Pain Vaso: Knee, 34 degrees, low pressure, 15 mins, Pain and Edema   PATIENT EDUCATION:  Education details: Plan of care, healing, HEP, and goals for therapy Person  educated: Patient Education method: Explanation Education comprehension: verbalized understanding  HOME EXERCISE PROGRAM: Today's interventions were added to her home exercise program and she was recommended to complete these at least twice a day.  She reported understanding.  ASSESSMENT:  CLINICAL IMPRESSION: Pt arrives for today's treatment session reporting 7/10 left knee pain.  Pt able to tolerate introduction to Nustep today with progression of seat for knee flexion.  Pt also introduced to several standing and seated LE exercises to increase strength and function.  Pt requiring min cues to avoid leaning with seated marches and LAQs.  Pt requiring min cues for proper technique with all newly added exercises.  Normal responses to estim and vaso noted upon removal.  Pt reported decreased pain at completion of today's treatment session.  OBJECTIVE IMPAIRMENTS: Abnormal gait, decreased mobility, difficulty walking, decreased ROM, decreased strength, hypomobility, increased edema, and pain.   ACTIVITY LIMITATIONS: standing, stairs, transfers, and locomotion level  PARTICIPATION LIMITATIONS: shopping and community activity  PERSONAL FACTORS: 3+ comorbidities: Hypertension, asthma, depression, obesity, current smoker, anxiety, and arthritis  are also affecting patient's functional outcome.   REHAB POTENTIAL: Good  CLINICAL DECISION MAKING: Stable/uncomplicated  EVALUATION COMPLEXITY: Low   GOALS: Goals reviewed with patient? Yes  LONG TERM GOALS: Target date: 08/10/22  Patient will be independent with her HEP. Baseline:  Goal status: INITIAL  2.  Patient will be able to demonstrate at least 120 degrees of left knee flexion for improved function descending stairs. Baseline:  Goal status: INITIAL  3.  Patient will be able to demonstrate active left knee extension within 5 degrees of neutral for improved gait mechanics. Baseline:  Goal status: INITIAL  4.  Patient will be able to  ambulate without an assistive device with no significant gait deviations. Baseline:  Goal status: INITIAL  PLAN:  PT FREQUENCY: 1-2x/week  PT DURATION: 4 weeks  PLANNED INTERVENTIONS: Therapeutic exercises, Therapeutic activity, Neuromuscular re-education, Balance training, Gait training, Patient/Family education, Self Care, Joint mobilization, Stair training, Electrical stimulation, Cryotherapy, Moist heat, Vasopneumatic device, Manual therapy, and Re-evaluation  PLAN FOR NEXT SESSION: NuStep, bedside, oral quads, balance interventions, gait training, and modalities as needed   Kathrynn Ducking, PTA 07/18/2022, 1:54 PM

## 2022-07-20 ENCOUNTER — Ambulatory Visit: Payer: Medicaid Other | Admitting: Physical Therapy

## 2022-07-20 DIAGNOSIS — M25662 Stiffness of left knee, not elsewhere classified: Secondary | ICD-10-CM

## 2022-07-20 DIAGNOSIS — M25562 Pain in left knee: Secondary | ICD-10-CM

## 2022-07-20 NOTE — Therapy (Signed)
OUTPATIENT PHYSICAL THERAPY LOWER EXTREMITY EVALUATION   Patient Name: Natalie Acevedo MRN: [id number] DOB:Mar 09, 1971, 52 y.o., female Today's Date: 07/20/2022  END OF SESSION:  PT End of Session - 07/20/22 1335     Visit Number 3    Number of Visits 8    Date for PT Re-Evaluation 09/23/22    PT Start Time 0101    PT Stop Time 0153    PT Time Calculation (min) 52 min    Activity Tolerance Patient tolerated treatment well    Behavior During Therapy [facility] for tasks assessed/performed             Past Medical History:  Diagnosis Date   Anemia    Anxiety    Arthritis    Asthma    Depression    Hypertension    Past Surgical History:  Procedure Laterality Date   CERVICAL DISCECTOMY     CESAREAN SECTION     X4   TOTAL KNEE ARTHROPLASTY Left 07/11/2022   Procedure: TOTAL KNEE ARTHROPLASTY;  Surgeon: Willaim Sheng, MD;  Location: [facility];  Service: Orthopedics;  Laterality: Left;   Patient Active Problem List   Diagnosis Date Noted   Mild intermittent asthma without complication [id number]   Moderate episode of recurrent major depressive disorder ([place]) 10/03/2018   Essential hypertension 02/28/2018   Muscle cramps 02/28/2018   Obesity (BMI 35.0-39.9 without comorbidity) 02/28/2018   REFERRING PROVIDER: Willaim Sheng, MD   REFERRING DIAG: POST OP LEFT TOTAL KNEE  REPLACEMENT 07/11/22   THERAPY DIAG:  Acute pain of left knee  Stiffness of left knee, not elsewhere classified  Rationale for Evaluation and Treatment: Rehabilitation  ONSET DATE: 07/11/22  SUBJECTIVE:   SUBJECTIVE STATEMENT: Pt arrives for today's treatment session reporting 7/10 left knee pain.    PERTINENT HISTORY: Hypertension, asthma, depression, obesity, current smoker, anxiety, and arthritis PAIN:  Are you having pain? Yes: NPRS scale: 7/10 Pain location: left knee Pain description: hard ache Aggravating factors: ice,  Relieving factors: medication  PRECAUTIONS:  None  WEIGHT BEARING RESTRICTIONS: No  FALLS:  Has patient fallen in last 6 months? No  LIVING ENVIRONMENT: Lives with: lives with their family Lives in: Mobile home Stairs: No Has following equipment at home: Crutches  OCCUPATION: not working  PLOF: Independent  PATIENT GOALS: walk without a crutch   NEXT MD VISIT: 08/11/22  OBJECTIVE:   COGNITION: Overall cognitive status: Within functional limits for tasks assessed     SENSATION: Patient reports no numbness or tingling  OBSERVATION:  No significant bruising or signs and symptoms of DVT   PALPATION: No reported tenderness to palpation around left leg  LOWER EXTREMITY ROM:  Active ROM Right eval Left eval  Hip flexion    Hip extension    Hip abduction    Hip adduction    Hip internal rotation    Hip external rotation    Knee flexion 120 98  Knee extension 5 14  Ankle dorsiflexion    Ankle plantarflexion    Ankle inversion    Ankle eversion     (Blank rows = not tested)  LOWER EXTREMITY MMT: not tested due to surgical condition  GAIT: Assistive device utilized: Crutches; 1 axillary crutch Level of assistance: Modified independence Comments: step to pattern with decreased stance time on LLE and minimal knee flexion in swing phase   TODAY'S TREATMENT:  DATE:                                     3/25 EXERCISE LOG  Exercise Repetitions and Resistance Comments  Nustep level 2 15 minutes moving seat forward x 2 to increase flexion.                               In supine:  PROM x 10 minutes into left knee flexion and extension f/b LE elevation and vasopneumatic x 20 minutes.   HOME EXERCISE PROGRAM: Today's interventions were added to her home exercise program and she was recommended to complete these at least twice a day.  She reported  understanding.  ASSESSMENT:  CLINICAL IMPRESSION: Patient is doing very well.  Walking with one axillary crutch.  Her range of motion is improving very well.  OBJECTIVE IMPAIRMENTS: Abnormal gait, decreased mobility, difficulty walking, decreased ROM, decreased strength, hypomobility, increased edema, and pain.   ACTIVITY LIMITATIONS: standing, stairs, transfers, and locomotion level  PARTICIPATION LIMITATIONS: shopping and community activity  PERSONAL FACTORS: 3+ comorbidities: Hypertension, asthma, depression, obesity, current smoker, anxiety, and arthritis  are also affecting patient's functional outcome.   REHAB POTENTIAL: Good  CLINICAL DECISION MAKING: Stable/uncomplicated  EVALUATION COMPLEXITY: Low   GOALS: Goals reviewed with patient? Yes  LONG TERM GOALS: Target date: 08/10/22  Patient will be independent with her HEP. Baseline:  Goal status: INITIAL  2.  Patient will be able to demonstrate at least 120 degrees of left knee flexion for improved function descending stairs. Baseline:  Goal status: INITIAL  3.  Patient will be able to demonstrate active left knee extension within 5 degrees of neutral for improved gait mechanics. Baseline:  Goal status: INITIAL  4.  Patient will be able to ambulate without an assistive device with no significant gait deviations. Baseline:  Goal status: INITIAL  PLAN:  PT FREQUENCY: 1-2x/week  PT DURATION: 4 weeks  PLANNED INTERVENTIONS: Therapeutic exercises, Therapeutic activity, Neuromuscular re-education, Balance training, Gait training, Patient/Family education, Self Care, Joint mobilization, Stair training, Electrical stimulation, Cryotherapy, Moist heat, Vasopneumatic device, Manual therapy, and Re-evaluation  PLAN FOR NEXT SESSION: NuStep, bedside, oral quads, balance interventions, gait training, and modalities as needed   Lulla Linville, [place], PT 07/20/2022, 1:53 PM

## 2022-07-27 ENCOUNTER — Ambulatory Visit: Payer: Medicaid Other | Attending: Orthopedic Surgery

## 2022-07-27 DIAGNOSIS — M25662 Stiffness of left knee, not elsewhere classified: Secondary | ICD-10-CM | POA: Insufficient documentation

## 2022-07-27 DIAGNOSIS — M25562 Pain in left knee: Secondary | ICD-10-CM | POA: Diagnosis present

## 2022-07-27 NOTE — Therapy (Addendum)
OUTPATIENT PHYSICAL THERAPY LOWER EXTREMITY TREATMENT   Patient Name: Natalie Acevedo MRN: [id number] DOB:05-Mar-1971, 52 y.o., female Today's Date: 07/27/2022  END OF SESSION:  PT End of Session - 07/27/22 1304     Visit Number 4    Number of Visits 8    Date for PT Re-Evaluation 09/23/22    PT Start Time 1300    PT Stop Time 1342    PT Time Calculation (min) 42 min    Activity Tolerance Patient tolerated treatment well    Behavior During Therapy WFL for tasks assessed/performed             Past Medical History:  Diagnosis Date   Anemia    Anxiety    Arthritis    Asthma    Depression    Hypertension    Past Surgical History:  Procedure Laterality Date   CERVICAL DISCECTOMY     CESAREAN SECTION     X4   TOTAL KNEE ARTHROPLASTY Left 07/11/2022   Procedure: TOTAL KNEE ARTHROPLASTY;  Surgeon: Joen Laura, MD;  Location: [facility];  Service: Orthopedics;  Laterality: Left;   Patient Active Problem List   Diagnosis Date Noted   Mild intermittent asthma without complication 10/03/2018   Moderate episode of recurrent major depressive disorder 10/03/2018   Essential hypertension 02/28/2018   Muscle cramps 02/28/2018   Obesity (BMI 35.0-39.9 without comorbidity) 02/28/2018   REFERRING PROVIDER: Joen Laura, MD   REFERRING DIAG: POST OP LEFT TOTAL KNEE  REPLACEMENT 07/11/22   THERAPY DIAG:  Acute pain of left knee  Stiffness of left knee, not elsewhere classified  Rationale for Evaluation and Treatment: Rehabilitation  ONSET DATE: 07/11/22  SUBJECTIVE:   SUBJECTIVE STATEMENT: Patient reports that she feels fantastic and her knee is hurting very slightly. She notes that this is due to her being high.   PERTINENT HISTORY: Hypertension, asthma, depression, obesity, current smoker, anxiety, and arthritis PAIN:  Are you having pain? Yes: NPRS scale: 1/10 Pain location: left knee Pain description: hard ache Aggravating factors: ice,  Relieving  factors: medication  PRECAUTIONS: None  WEIGHT BEARING RESTRICTIONS: No  FALLS:  Has patient fallen in last 6 months? No  LIVING ENVIRONMENT: Lives with: lives with their family Lives in: Mobile home Stairs: No Has following equipment at home: Crutches  OCCUPATION: not working  PLOF: Independent  PATIENT GOALS: walk without a crutch   NEXT MD VISIT: 08/11/22  OBJECTIVE:   COGNITION: Overall cognitive status: Within functional limits for tasks assessed     SENSATION: Patient reports no numbness or tingling  OBSERVATION:  No significant bruising or signs and symptoms of DVT   PALPATION: No reported tenderness to palpation around left leg  LOWER EXTREMITY ROM:  Active ROM Right eval Left eval  Hip flexion    Hip extension    Hip abduction    Hip adduction    Hip internal rotation    Hip external rotation    Knee flexion 120 98  Knee extension 5 14  Ankle dorsiflexion    Ankle plantarflexion    Ankle inversion    Ankle eversion     (Blank rows = not tested)  LOWER EXTREMITY MMT: not tested due to surgical condition  GAIT: Assistive device utilized: Crutches; 1 axillary crutch Level of assistance: Modified independence Comments: step to pattern with decreased stance time on LLE and minimal knee flexion in swing phase   TODAY'S TREATMENT:  DATE:                                     4/3 EXERCISE LOG  Exercise Repetitions and Resistance Comments  Nustep  L3 x 15 minutes; seat 7-6   Gastroc stretch  4 x 30 seconds   Seated HS stretch 4 x 30 seconds   LAQ 2# x 3 minutes   Seated marching  2# x 3 minutes   Standing HS curl  3 minutes   Tandem on foam  3 x 30 seconds each     Blank cell = exercise not performed today                                    3/25 EXERCISE LOG  Exercise Repetitions and Resistance Comments  Nustep level  2 15 minutes moving seat forward x 2 to increase flexion.   In supine:  PROM x 10 minutes into left knee flexion and extension f/b LE elevation and vasopneumatic x 20 minutes.   HOME EXERCISE PROGRAM:   ASSESSMENT:  CLINICAL IMPRESSION: Patient was introduced to multiple new interventions for improved knee mobility needed for improved function with her daily activities. She required minimal cueing with seated hamstring stretching for proper positioning to facilitate improved soft tissue extensibility. She reported that her knee felt good upon the conclusion of treatment, but she was experiencing some low back pain. She continues to require skilled physical therapy to address her remaining impairments to return to her prior level of function.   PHYSICAL THERAPY DISCHARGE SUMMARY  Visits from Start of Care: 4  Current functional level related to goals / functional outcomes: Patient was unable to meet her goals for skilled physical therapy.    Remaining deficits: AROM and gait mechanics   Education / Equipment: HEP   Patient agrees to discharge. Patient goals were not met. Patient is being discharged due to not returning since the last visit.  Candi Leash, PT, DPT    OBJECTIVE IMPAIRMENTS: Abnormal gait, decreased mobility, difficulty walking, decreased ROM, decreased strength, hypomobility, increased edema, and pain.   ACTIVITY LIMITATIONS: standing, stairs, transfers, and locomotion level  PARTICIPATION LIMITATIONS: shopping and community activity  PERSONAL FACTORS: 3+ comorbidities: Hypertension, asthma, depression, obesity, current smoker, anxiety, and arthritis  are also affecting patient's functional outcome.   REHAB POTENTIAL: Good  CLINICAL DECISION MAKING: Stable/uncomplicated  EVALUATION COMPLEXITY: Low   GOALS: Goals reviewed with patient? Yes  LONG TERM GOALS: Target date: 08/10/22  Patient will be independent with her HEP. Baseline:  Goal status:  INITIAL  2.  Patient will be able to demonstrate at least 120 degrees of left knee flexion for improved function descending stairs. Baseline:  Goal status: INITIAL  3.  Patient will be able to demonstrate active left knee extension within 5 degrees of neutral for improved gait mechanics. Baseline:  Goal status: INITIAL  4.  Patient will be able to ambulate without an assistive device with no significant gait deviations. Baseline:  Goal status: INITIAL  PLAN:  PT FREQUENCY: 1-2x/week  PT DURATION: 4 weeks  PLANNED INTERVENTIONS: Therapeutic exercises, Therapeutic activity, Neuromuscular re-education, Balance training, Gait training, Patient/Family education, Self Care, Joint mobilization, Stair training, Electrical stimulation, Cryotherapy, Moist heat, Vasopneumatic device, Manual therapy, and Re-evaluation  PLAN FOR NEXT SESSION: NuStep, bedside, oral quads, balance interventions, gait training, and  modalities as needed   Granville Lewis, PT 07/27/2022, 2:00 PM

## 2022-07-29 ENCOUNTER — Ambulatory Visit: Payer: Medicaid Other | Admitting: Physical Therapy

## 2022-07-29 ENCOUNTER — Encounter: Payer: Medicaid Other | Admitting: Physical Therapy

## 2022-08-01 ENCOUNTER — Ambulatory Visit: Payer: Medicaid Other

## 2022-08-03 ENCOUNTER — Ambulatory Visit: Payer: Medicaid Other | Admitting: Physical Therapy

## 2022-12-05 ENCOUNTER — Telehealth: Payer: Self-pay | Admitting: Neurology

## 2022-12-05 NOTE — Telephone Encounter (Signed)
Caller states she takes migraine medication needs a PA in order to fill it

## 2022-12-09 NOTE — Telephone Encounter (Signed)
LMOVM please advise the medication needed to be refilled.

## 2022-12-09 NOTE — Telephone Encounter (Signed)
Pt calling back about medication needing PA. Still needs refill

## 2022-12-12 ENCOUNTER — Other Ambulatory Visit (HOSPITAL_COMMUNITY): Payer: Self-pay

## 2022-12-12 ENCOUNTER — Telehealth: Payer: Self-pay | Admitting: Neurology

## 2022-12-12 ENCOUNTER — Telehealth: Payer: Self-pay | Admitting: Pharmacy Technician

## 2022-12-12 NOTE — Telephone Encounter (Signed)
Caller is calling back about PA. Stated she has made appointment and she needs medication for migraine asap

## 2022-12-12 NOTE — Telephone Encounter (Signed)
Pharmacy Patient Advocate Encounter  Received notification from [facility] that Prior Authorization for Aimovig 140MG /ML auto-injectors  has been APPROVED from 12/12/2022 to 12/12/2023. Ran test claim, Copay is $4.00. This test claim was processed through [facility]- copay amounts may vary at other pharmacies due to pharmacy/plan contracts, or as the patient moves through the different stages of their insurance plan.   PA #/Case ID/Reference #: [id number]

## 2022-12-12 NOTE — Telephone Encounter (Signed)
Per patient she needs a PA.   Pa team please start a PA for Aimovig 140 mg.  Patient is past due for her shot.

## 2022-12-12 NOTE — Telephone Encounter (Signed)
Pharmacy Patient Advocate Encounter   Received notification from Pt Calls Messages that prior authorization for Aimovig 140MG /ML auto-injectors is required/requested.   Insurance verification completed.   The patient is insured through [facility] .   Per test claim: PA required; PA submitted to [facility] via CoverMyMeds Key/confirmation #/EOC [id number] Status is pending

## 2022-12-12 NOTE — Telephone Encounter (Signed)
Patient advised of approval via mychart

## 2022-12-12 NOTE — Telephone Encounter (Signed)
See 8/16 encounter.

## 2022-12-12 NOTE — Telephone Encounter (Signed)
PA request has been Submitted. New Encounter created for follow up. For additional info see Pharmacy Prior Auth telephone encounter from 12/12/2022.

## 2023-01-30 ENCOUNTER — Telehealth: Payer: Self-pay | Admitting: Neurology

## 2023-01-30 NOTE — Telephone Encounter (Signed)
Patient hasn't been seen in over a year.

## 2023-01-30 NOTE — Telephone Encounter (Signed)
1. Which medications need refilled? (List name and dosage, if known) Natalie Acevedo - might need a new PA  2. Which pharmacy/location is medication to be sent to? (include street and city if Insurance claims handler) [facility]

## 2023-01-31 MED ORDER — UBRELVY 100 MG PO TABS: 1.0000 | ORAL_TABLET | ORAL | 5 refills | Status: DC | PRN

## 2023-01-31 NOTE — Telephone Encounter (Signed)
Patient is scheduled for 06/12/2023

## 2023-01-31 NOTE — Telephone Encounter (Signed)
Pt called back in really needing her Natalie Acevedo called in

## 2023-02-01 ENCOUNTER — Telehealth: Payer: Self-pay | Admitting: Neurology

## 2023-02-01 NOTE — Telephone Encounter (Signed)
Patient is calling stating that the pharmacy is telling her they do not have her medication

## 2023-02-01 NOTE — Telephone Encounter (Signed)
Pharmacy needs a PA

## 2023-02-02 MED ORDER — PREDNISONE 10 MG (21) PO TBPK: ORAL_TABLET | ORAL | 0 refills | Status: DC

## 2023-02-02 NOTE — Telephone Encounter (Signed)
Pt called in wanting a status of the PA

## 2023-02-02 NOTE — Telephone Encounter (Signed)
Per patient she had a migraine for a week now and it is kicking my but.   Offered a Cocktail but patient states that will not help.   Offered patient Prednisone taper. Per patient please ask if I can get that then.   Per Dr.Jaffe okay to call in a prednisone taper.

## 2023-02-16 ENCOUNTER — Telehealth: Payer: Self-pay | Admitting: Pharmacy Technician

## 2023-02-16 ENCOUNTER — Telehealth: Payer: Self-pay | Admitting: Neurology

## 2023-02-16 NOTE — Telephone Encounter (Signed)
Pharmacy Patient Advocate Encounter   Received notification from CoverMyMeds that prior authorization for UBRELVY 100MG  is required/requested.   Insurance verification completed.   The patient is insured through [facility] .   Per test claim: PA required; PA submitted to [facility] via CoverMyMeds Key/confirmation #/EOC Danella Penton Status is pending

## 2023-02-16 NOTE — Telephone Encounter (Signed)
Patient is calling about Pa. Patient wants to know if it can be rushed

## 2023-02-16 NOTE — Telephone Encounter (Signed)
PA has been submitted as EXPEDITED, and telephone encounter has been created.

## 2023-03-03 ENCOUNTER — Other Ambulatory Visit: Payer: Self-pay | Admitting: Neurology

## 2023-06-09 NOTE — Progress Notes (Deleted)
 NEUROLOGY FOLLOW UP OFFICE NOTE  Natalie Acevedo [id number]  Assessment/Plan:   Migraine without aura, without status migrainosus, not intractable Right hand weakness - MRI negative for stroke.  No pain or numbness to suggest radiculopathy/carpal tunnel syndrome.  No objective weakness on exam.  Unclear etiology    Plan: Migraine prevention:  Aimovig 140mg  Q4wks *** Migraine rescue:  Ubrelvy 100mg  *** Limit use of pain relievers to no more than 2 days out of week to prevent risk of rebound or medication-overuse headache. Keep headache diary Follow up ***   Subjective:  Natalie Acevedo is a 53 year old woman who follows up for migraines.   UPDATE: ***. Intensity:  Moderate Duration:  1 hour Frequency:  2 days a week. Frequency of abortive medication: daily Current NSAIDS:  none Current analgesics:  none Current triptans:  none Current muscle relaxants:  Tizanidine 4mg  Current antihypertensive:  Losartan, HCTZ Current Antidepressant medications:  Lexapro, Wellbutrin Current antiepileptic medication:  gabapentin Current CGRP inhibitor:  Aimovig 140mg , Ubrelvy (sample)     HISTORY:  Migraines: Onset:  April 2020.  She reports feeling menopausal.  She is feeling more depressed and irritable.  Many years ago, she had minor daily headaches which responded to acupuncture. Location:  bifrontal Quality:  throbbing Initial tensity:  10/10.  She denies new headache, thunderclap headache  Aura:  no Premonitory Phase:  no Postdrome:  no Associated symptoms:  Sometimes nausea..  She denies associated vomiting, photophobia, phonophobia, visual disturbance or unilateral numbness or weakness. Initial duration:  Varies hour to several hours Initial frequency:  daily Initial frequency of abortive medication: Excedrin Migraine daily.  Sumatriptan once in past month. Triggers:  Valsalva (coughting, sneezing) Relieving factors:  no Activity:  Aggravates. Sometimes dizziness and  staggering.  Some memory issues.   MRI of brain with and without contrast from 10/15/2018 was unremarkable.     Past triptan:  Sumatriptan 50mg ; rizatriptan 10mg  Past anticonvulsant:  topiramate 100mg ;  Other: In August 2022, she reported new right upper extremity weakness.   She will suddenly drop objects from her right hand.  No neck or radicular pain or numbness.No involvement of her leg.  Once in awhile she will see a black spot in the mid lower half of visual field only in the left eye lasting about 3 minutes.  No associated headache.  It has occurred about 3 or 4 times.  MRI of brain without contrast on 12/27/2020 was unremarkable.  Still may drop an object here and there.  Notes twitching of upper extremities from time to time.  She discontinued gabapentin ***     Smoker:  1  ppd Depression:  yes; Anxiety:  yes.  She says she is menopausal.  She is more irritable.  Endorses mood swings Family history of headache:  no  PAST MEDICAL HISTORY: Past Medical History:  Diagnosis Date   Anemia    Anxiety    Arthritis    Asthma    Depression    Hypertension     MEDICATIONS: Current Outpatient Medications on File Prior to Visit  Medication Sig Dispense Refill   AIMOVIG 140 MG/ML SOAJ INJECT 140MG  EVERY 28 DAYS 1 mL 5   albuterol (VENTOLIN HFA) 108 (90 Base) MCG/ACT inhaler Inhale 1-2 puffs into the lungs every 6 (six) hours as needed for wheezing or shortness of breath.     buPROPion (WELLBUTRIN SR) 100 MG 12 hr tablet Take 100 mg by mouth 2 (two) times daily.     celecoxib (  CELEBREX) 200 MG capsule Take 200 mg by mouth in the morning.     eletriptan (RELPAX) 40 MG tablet Take 1 tablet (40 mg total) by mouth as needed for migraine or headache. May repeat in 2 hours if headache persists or recurs. (Patient not taking: Reported on 06/24/2022) 10 tablet 5   escitalopram (LEXAPRO) 10 MG tablet Take 10 mg by mouth in the morning.     hydrochlorothiazide (HYDRODIURIL) 25 MG tablet Take 25 mg by  mouth in the morning.     HYDROcodone-acetaminophen (NORCO) 10-325 MG tablet Take 1 tablet by mouth 4 (four) times daily as needed (pain.).     losartan (COZAAR) 50 MG tablet TAKE ONE TABLET BY MOUTH TWICE DAILY     predniSONE (STERAPRED UNI-PAK 21 TAB) 10 MG (21) TBPK tablet take 60mg  day 1, then 50mg  day 2, then 40mg  day 3, then 30mg  day 4, then 20mg  day 5, then 10mg  day 6, then STOP 21 tablet 0   QUEtiapine (SEROQUEL) 50 MG tablet Take 50 mg by mouth at bedtime.     tiZANidine (ZANAFLEX) 4 MG tablet Take 4 mg by mouth every 8 (eight) hours as needed for muscle spasms.  1   Ubrogepant (UBRELVY) 100 MG TABS Take 1 tablet (100 mg total) by mouth as needed (May repeat after 2 hours.  Maximum 2 tablets in 24 hours.). 10 tablet 5   No current facility-administered medications on file prior to visit.      ALLERGIES: Allergies  Allergen Reactions   Zestril [Lisinopril] Cough    FAMILY HISTORY: Family History  Problem Relation Age of Onset   Kidney disease Mother    Cancer Maternal Grandmother       Objective:  *** General: No acute distress.  Patient appears well-groomed.   Head:  Normocephalic/atraumatic Eyes:  Fundi examined but not visualized Neck: supple, no paraspinal tenderness, full range of motion Heart:  Regular rate and rhythm Neurological Exam: alert and oriented.  Speech fluent and not dysarthric, language intact.  CN II-XII intact. Bulk and tone normal, muscle strength 5/5 throughout.  Sensation to light touch intact.  Deep tendon reflexes 2+ throughout, toes downgoing.  Finger to nose testing intact.  Gait normal, Romberg negative.    Natalie Millet, DO  CC: Natalie Lunger, DO

## 2023-06-12 ENCOUNTER — Ambulatory Visit: Payer: Medicaid Other | Admitting: Neurology

## 2023-06-12 ENCOUNTER — Encounter: Payer: Self-pay | Admitting: Neurology

## 2023-08-08 NOTE — Telephone Encounter (Signed)
 Pharmacy Patient Advocate Encounter  Received notification from [employer] that Prior Authorization for UBRELVY 100MG  has been APPROVED from 10.24.24 to 10.24.25   PA #/Case ID/Reference #: [id number]

## 2023-09-04 ENCOUNTER — Encounter (HOSPITAL_COMMUNITY): Payer: Self-pay

## 2023-09-11 ENCOUNTER — Ambulatory Visit: Payer: Self-pay | Admitting: Nurse Practitioner

## 2023-09-11 ENCOUNTER — Ambulatory Visit (INDEPENDENT_AMBULATORY_CARE_PROVIDER_SITE_OTHER): Admitting: Nurse Practitioner

## 2023-09-11 ENCOUNTER — Encounter: Payer: Self-pay | Admitting: Nurse Practitioner

## 2023-09-11 VITALS — BP 99/67 | HR 86 | Temp 97.9°F | Ht 62.0 in | Wt 192.0 lb

## 2023-09-11 DIAGNOSIS — Z6835 Body mass index (BMI) 35.0-35.9, adult: Secondary | ICD-10-CM

## 2023-09-11 DIAGNOSIS — Z1231 Encounter for screening mammogram for malignant neoplasm of breast: Secondary | ICD-10-CM | POA: Insufficient documentation

## 2023-09-11 DIAGNOSIS — F331 Major depressive disorder, recurrent, moderate: Secondary | ICD-10-CM

## 2023-09-11 DIAGNOSIS — F17200 Nicotine dependence, unspecified, uncomplicated: Secondary | ICD-10-CM | POA: Insufficient documentation

## 2023-09-11 DIAGNOSIS — Z0001 Encounter for general adult medical examination with abnormal findings: Secondary | ICD-10-CM | POA: Insufficient documentation

## 2023-09-11 DIAGNOSIS — R0602 Shortness of breath: Secondary | ICD-10-CM | POA: Diagnosis not present

## 2023-09-11 DIAGNOSIS — I10 Essential (primary) hypertension: Secondary | ICD-10-CM

## 2023-09-11 DIAGNOSIS — F1111 Opioid abuse, in remission: Secondary | ICD-10-CM | POA: Insufficient documentation

## 2023-09-11 DIAGNOSIS — E66812 Obesity, class 2: Secondary | ICD-10-CM

## 2023-09-11 DIAGNOSIS — F17210 Nicotine dependence, cigarettes, uncomplicated: Secondary | ICD-10-CM

## 2023-09-11 DIAGNOSIS — R600 Localized edema: Secondary | ICD-10-CM

## 2023-09-11 DIAGNOSIS — J4520 Mild intermittent asthma, uncomplicated: Secondary | ICD-10-CM

## 2023-09-11 DIAGNOSIS — R002 Palpitations: Secondary | ICD-10-CM | POA: Insufficient documentation

## 2023-09-11 DIAGNOSIS — Z1211 Encounter for screening for malignant neoplasm of colon: Secondary | ICD-10-CM | POA: Insufficient documentation

## 2023-09-11 LAB — BAYER DCA HB A1C WAIVED: HB A1C (BAYER DCA - WAIVED): 5.2 % (ref 4.8–5.6)

## 2023-09-11 MED ORDER — ALBUTEROL SULFATE HFA 108 (90 BASE) MCG/ACT IN AERS: 1.0000 | INHALATION_SPRAY | Freq: Four times a day (QID) | RESPIRATORY_TRACT | 0 refills | Status: AC | PRN

## 2023-09-11 MED ORDER — BUPROPION HCL ER (SR) 100 MG PO TB12: 100.0000 mg | ORAL_TABLET | Freq: Two times a day (BID) | ORAL | 0 refills | Status: AC

## 2023-09-11 MED ORDER — LOSARTAN POTASSIUM 25 MG PO TABS: 25.0000 mg | ORAL_TABLET | Freq: Every day | ORAL | 0 refills | Status: AC

## 2023-09-11 MED ORDER — FUROSEMIDE 20 MG PO TABS: ORAL_TABLET | ORAL | 0 refills | Status: DC

## 2023-09-11 NOTE — Progress Notes (Addendum)
 New Patient Office Visit  Subjective   Patient ID: Natalie Acevedo, female    DOB: [birth year]  Age: 53 y.o. MRN: [id number]  CC:  Chief Complaint  Patient presents with   Establish Care   Foot Swelling    Bilateral feet swelling for 6 months    Shortness of Breath    SOB for few months, wheezing, worse with exertion    Palpitations    Heart palpitations since she was a child     HPI Natalie Acevedo is a 53 year old female who presented on Sep 11, 2023, to establish care and to request a referral to cardiology. She reports experiencing bilateral lower extremity edema and heart palpitations. At the time of the encounter, she was consuming an energy drink and noted, "this is the first one I have had in one week." The patient has a history of hypertension, currently managed with losartan  25 mg and hydrochlorothiazide  (HCTZ) 25 mg. She complains of dizziness and shortness of breath; her blood pressure today was 97/67. A plan was made to discontinue HCTZ and continue losartan . She also has a history of migraines but has not followed up with neurology. She reports experiencing one severe migraine per month, in addition to milder headaches relieved with Tylenol . She missed a neurology appointment in March and was informed she would need to wait six months for rescheduling. Regarding sleep, she reports chronic insomnia and states she has only been getting three hours of sleep nightly despite taking Seroquel . She declined a referral to psychiatry, stating, "I'm not crazy. I don't want to go see psychiatry. I already processed my childhood and adulthood trauma." The patient has a known diagnosis of major depressive disorder (MDD) and is currently taking Lexapro  and Wellbutrin .   Smoke 1-pack daily     09/11/2023   11:06 AM 09/09/2016   12:43 PM  PHQ9 SCORE ONLY  PHQ-9 Total Score 17 0       09/11/2023   11:08 AM  GAD 7 : Generalized Anxiety Score  Nervous, Anxious, on Edge 1  Control/stop  worrying 0  Worry too much - different things 0  Trouble relaxing 1  Restless 1  Easily annoyed or irritable 2  Afraid - awful might happen 0  Total GAD 7 Score 5  Anxiety Difficulty Somewhat difficult     Outpatient Encounter Medications as of 09/11/2023  Medication Sig   AIMOVIG  140 MG/ML SOAJ INJECT 140MG  EVERY 28 DAYS   buprenorphine-naloxone (SUBOXONE) 8-2 mg SUBL SL tablet Place 1 tablet under the tongue.   celecoxib  (CELEBREX ) 200 MG capsule Take 200 mg by mouth in the morning.   escitalopram  (LEXAPRO ) 10 MG tablet Take 10 mg by mouth in the morning.   furosemide  (LASIX ) 20 MG tablet Take 1-tab for 3-days for BLE, 1-tab daily PRN for no greater that 7 days for BLE   gabapentin (NEURONTIN) 400 MG capsule Take 400 mg by mouth.   losartan  (COZAAR ) 25 MG tablet Take 1 tablet (25 mg total) by mouth daily.   QUEtiapine  (SEROQUEL ) 50 MG tablet Take 50 mg by mouth at bedtime.   tiZANidine (ZANAFLEX) 4 MG tablet Take 4 mg by mouth every 8 (eight) hours as needed for muscle spasms.   [DISCONTINUED] albuterol  (VENTOLIN  HFA) 108 (90 Base) MCG/ACT inhaler Inhale 1-2 puffs into the lungs every 6 (six) hours as needed for wheezing or shortness of breath.   [DISCONTINUED] hydrochlorothiazide  (HYDRODIURIL ) 25 MG tablet Take 25 mg by mouth in the morning.   [  DISCONTINUED] losartan  (COZAAR ) 50 MG tablet TAKE ONE TABLET BY MOUTH TWICE DAILY   albuterol  (VENTOLIN  HFA) 108 (90 Base) MCG/ACT inhaler Inhale 1-2 puffs into the lungs every 6 (six) hours as needed for wheezing or shortness of breath.   buPROPion  ER (WELLBUTRIN  SR) 100 MG 12 hr tablet Take 1 tablet (100 mg total) by mouth 2 (two) times daily.   eletriptan  (RELPAX ) 40 MG tablet Take 1 tablet (40 mg total) by mouth as needed for migraine or headache. May repeat in 2 hours if headache persists or recurs. (Patient not taking: Reported on 06/24/2022)   Ubrogepant  (UBRELVY ) 100 MG TABS Take 1 tablet (100 mg total) by mouth as needed (May repeat after 2  hours.  Maximum 2 tablets in 24 hours.). (Patient not taking: Reported on 09/11/2023)   [DISCONTINUED] buPROPion  (WELLBUTRIN  SR) 100 MG 12 hr tablet Take 100 mg by mouth 2 (two) times daily.   [DISCONTINUED] HYDROcodone-acetaminophen  (NORCO) 10-325 MG tablet Take 1 tablet by mouth 4 (four) times daily as needed (pain.). (Patient not taking: Reported on 09/11/2023)   [DISCONTINUED] predniSONE  (STERAPRED UNI-PAK 21 TAB) 10 MG (21) TBPK tablet take 60mg  day 1, then 50mg  day 2, then 40mg  day 3, then 30mg  day 4, then 20mg  day 5, then 10mg  day 6, then STOP (Patient not taking: Reported on 09/11/2023)   No facility-administered encounter medications on file as of 09/11/2023.    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   Procedure: TOTAL KNEE ARTHROPLASTY;  Surgeon: Murleen Arms, MD;  Location: [facility];  Service: Orthopedics;  Laterality: Left;    Family History  Problem Relation Age of Onset   Kidney disease Mother    Cancer Maternal Grandmother     Social History   Socioeconomic History   Marital status: Divorced    Spouse name: Not on file   Number of children: Not on file   Years of education: Not on file   Highest education level: Not on file  Occupational History   Not on file  Tobacco Use   Smoking status: Every Day    Current packs/day: 0.50    Average packs/day: 0.5 packs/day for 30.0 years (15.0 ttl pk-yrs)    Types: Cigarettes   Smokeless tobacco: Never  Vaping Use   Vaping status: Never Used  Substance and Sexual Activity   Alcohol  use: Not Currently   Drug use: No   Sexual activity: Not on file  Other Topics Concern   Not on file  Social History Narrative   Right handed   Lives in single story home with sister and daughter   Drinks lots  of caffine, coffe and energy drink, and 2-3 sodas a day   Social Drivers of Health   Financial Resource Strain: Low Risk  (01/02/2023)   Received from [facility]   Overall Financial Resource Strain (CARDIA)    Difficulty of Paying Living Expenses: Not hard at all  Food Insecurity: No Food Insecurity (09/04/2023)   Received from [facility]   Hunger Vital Sign    Worried About Running Out of Food in the Last Year: Never true    Ran Out of Food in  the Last Year: Never true  Transportation Needs: No Transportation Needs (09/04/2023)   Received from [facility] - Transportation    Lack of Transportation (Medical): No    Lack of Transportation (Non-Medical): No  Physical Activity: Insufficiently Active (01/02/2023)   [facility] from [facility]   Exercise Vital Sign    Days of Exercise per Week: 4 days    Minutes of Exercise per Session: 30 min  Stress: No Stress Concern Present (01/02/2023)   Received from [facility] - Occupational Stress Questionnaire    Feeling of Stress : Not at all  Social Connections: Not on file  Intimate Partner Violence: Not on file    Review of Systems  Constitutional:  Negative for chills and fever.  HENT:  Negative for congestion and sore throat.   Respiratory:  Negative for cough, shortness of breath and wheezing.   Cardiovascular:  Negative for chest pain and leg swelling.  Gastrointestinal:  Negative for constipation, diarrhea, nausea and vomiting.  Musculoskeletal:  Negative for falls.  Skin:  Negative for itching and rash.  Neurological:  Negative for dizziness and headaches.  Psychiatric/Behavioral:  Positive for depression. Negative for substance abuse and suicidal ideas. The patient is nervous/anxious.    Negative unless indicated in HPI    Objective   BP 99/67   Pulse 86   Temp 97.9 F (36.6 C) (Temporal)   Ht 5\' 2"  (1.575 m)   Wt 192 lb (87.1 kg)   LMP 08/06/2016    SpO2 (!) 88%   BMI 35.12 kg/m   Physical Exam Vitals and nursing note reviewed.  Constitutional:      General: She is not in acute distress.    Appearance: She is obese.  HENT:     Head: Normocephalic and atraumatic.     Right Ear: Tympanic membrane, ear canal and external ear normal. There is no impacted cerumen.     Left Ear: Tympanic membrane, ear canal and external ear normal. There is no impacted cerumen.     Nose: Nose normal.     Mouth/Throat:     Mouth: Mucous membranes are moist.  Eyes:     General: No scleral icterus.    Extraocular Movements: Extraocular movements intact.     Conjunctiva/sclera: Conjunctivae normal.     Pupils: Pupils are equal, round, and reactive to light.  Cardiovascular:     Heart sounds: Normal heart sounds.  Pulmonary:     Effort: Pulmonary effort is normal.     Breath sounds: Normal breath sounds.  Abdominal:     General: Bowel sounds are normal.     Palpations: Abdomen is soft.  Musculoskeletal:        General: Normal range of motion.     Right lower leg: No edema.     Left lower leg: No edema.  Skin:    General: Skin is warm and dry.     Findings: No rash.  Neurological:     Mental Status: She is alert and oriented to person, place, and time.  Psychiatric:        Attention and Perception: Attention and perception normal.        Mood and Affect: Mood normal.        Speech: Speech normal.        Behavior: Behavior normal. Behavior is cooperative.        Thought Content: Thought content normal. Thought content does not  include homicidal or suicidal ideation. Thought content does not include homicidal or suicidal plan.        Cognition and Memory: Cognition and memory normal.        Judgment: Judgment normal.     Last CBC Lab Results  Component Value Date   WBC 5.7 09/11/2023   HGB 10.9 (L) 09/11/2023   HCT 37.4 09/11/2023   MCV 70 (L) 09/11/2023   MCH 20.5 (L) 09/11/2023   RDW 20.5 (H) 09/11/2023   PLT 128 (L) 09/11/2023    Last metabolic panel Lab Results  Component Value Date   GLUCOSE 90 09/11/2023   NA 141 09/11/2023   K 3.7 09/11/2023   CL 97 09/11/2023   CO2 30 (H) 09/11/2023   BUN 17 09/11/2023   CREATININE 0.80 09/11/2023   GFRNONAA >60 06/29/2022   CALCIUM 9.5 09/11/2023   PROT 6.4 09/11/2023   ALBUMIN 4.2 09/11/2023   LABGLOB 2.2 09/11/2023   BILITOT 0.3 09/11/2023   ALKPHOS 96 09/11/2023   AST 15 09/11/2023   ALT 12 09/11/2023   ANIONGAP 11 06/29/2022         Assessment & Plan:  Essential hypertension -     Comprehensive metabolic panel with GFR -     Losartan  Potassium; Take 1 tablet (25 mg total) by mouth daily.  Dispense: 90 tablet; Refill: 0  Edema of both lower extremities -     Furosemide ; Take 1-tab for 3-days for BLE, 1-tab daily PRN for no greater that 7 days for BLE  Dispense: 30 tablet; Refill: 0  Mild intermittent asthma without complication -     Comprehensive metabolic panel with GFR -     Albuterol  Sulfate HFA; Inhale 1-2 puffs into the lungs every 6 (six) hours as needed for wheezing or shortness of breath.  Dispense: 18 g; Refill: 0  Moderate episode of recurrent major depressive disorder (HCC) -     Lipid panel -     buPROPion  HCl ER (SR); Take 1 tablet (100 mg total) by mouth 2 (two) times daily.  Dispense: 180 tablet; Refill: 0  SOB (shortness of breath) on exertion -     CBC with Differential/Platelet -     Comprehensive metabolic panel with GFR -     EKG 12-Lead -     Ambulatory referral to Cardiology  Smokes less than 1 pack a day with greater than 40 pack year history -     Pulmonary Visit  Class 2 obesity with body mass index (BMI) of 35.0 to 35.9 in adult, unspecified obesity type, unspecified whether serious comorbidity present -     Lipid panel -     Thyroid Profile -     Bayer DCA Hb A1c Waived  Fluttering sensation of heart -     EKG 12-Lead -     Ambulatory referral to Cardiology  Tobacco dependence -     Pulmonary  Visit  Encounter for general adult medical examination with abnormal findings -     CBC with Differential/Platelet -     Comprehensive metabolic panel with GFR -     Lipid panel -     Thyroid Profile -     HepB+HepC+HIV Panel  Screening for colon cancer -     Cologuard  Screening mammogram for breast cancer -     3D Screening Mammogram, Left and Right  Opioid abuse, in remission (HCC)   Labs: CBC, CMP, THS, Lipid, Hep-C, hep-B, HIV results pending  SOB: CBC,  EKG, CMP  Heart fluttering: EKG (see EMR)  HTN: D/C hydrochlorothiazide , continue Losartan  20 mg daily   BLE Edema: Lasix  20 mg BID  Asthma: Well controlled, Continue Ventolin , refill provided  Opioids abuse: continue Suboxone 8-2 mg  daily (outside provider)  Health Maintenance: Mammogram, Cologuard  ordered, refer to pulmonology for lungs cancer screening  Encourage healthy lifestyle choices, including diet (rich in fruits, vegetables, and lean proteins, and low in salt and simple carbohydrates) and exercise (at least 30 minutes of moderate physical activity daily).     The above assessment and management plan was discussed with the patient. The patient verbalized understanding of and has agreed to the management plan. Patient is aware to call the clinic if they develop any new symptoms or if symptoms persist or worsen. Patient is aware when to return to the clinic for a follow-up visit. Patient educated on when it is appropriate to go to the emergency department.  Return in about 3 months (around 12/12/2023) for chronic Diseases Management.   Omah Dewalt [place] Thompson, DNP [facility] [address] [place], [place] [address] [phone]  Note: This document was prepared by [employer] voice dictation technology and any errors that results from this process are unintentional.

## 2023-09-12 ENCOUNTER — Telehealth: Payer: Self-pay | Admitting: Family Medicine

## 2023-09-12 LAB — HEPB+HEPC+HIV PANEL
HIV Screen 4th Generation wRfx: NONREACTIVE
Hep B C IgM: NEGATIVE
Hep B Core Total Ab: NEGATIVE
Hep B E Ab: NONREACTIVE
Hep B E Ag: NEGATIVE
Hep B Surface Ab, Qual: NONREACTIVE
Hep C Virus Ab: NONREACTIVE
Hepatitis B Surface Ag: NEGATIVE

## 2023-09-12 LAB — COMPREHENSIVE METABOLIC PANEL WITH GFR
ALT: 12 IU/L (ref 0–32)
AST: 15 IU/L (ref 0–40)
Albumin: 4.2 g/dL (ref 3.8–4.9)
Alkaline Phosphatase: 96 IU/L (ref 44–121)
BUN/Creatinine Ratio: 21 (ref 9–23)
BUN: 17 mg/dL (ref 6–24)
Bilirubin Total: 0.3 mg/dL (ref 0.0–1.2)
CO2: 30 mmol/L — ABNORMAL HIGH (ref 20–29)
Calcium: 9.5 mg/dL (ref 8.7–10.2)
Chloride: 97 mmol/L (ref 96–106)
Creatinine, Ser: 0.8 mg/dL (ref 0.57–1.00)
Globulin, Total: 2.2 g/dL (ref 1.5–4.5)
Glucose: 90 mg/dL (ref 70–99)
Potassium: 3.7 mmol/L (ref 3.5–5.2)
Sodium: 141 mmol/L (ref 134–144)
Total Protein: 6.4 g/dL (ref 6.0–8.5)
eGFR: 87 mL/min/{1.73_m2} (ref >59)

## 2023-09-12 LAB — CBC WITH DIFFERENTIAL/PLATELET
Basophils Absolute: 0.1 10*3/uL (ref 0.0–0.2)
Basos: 1 %
EOS (ABSOLUTE): 0.2 10*3/uL (ref 0.0–0.4)
Eos: 3 %
Hematocrit: 37.4 % (ref 34.0–46.6)
Hemoglobin: 10.9 g/dL — ABNORMAL LOW (ref 11.1–15.9)
Immature Grans (Abs): 0 10*3/uL (ref 0.0–0.1)
Immature Granulocytes: 0 %
Lymphocytes Absolute: 1.2 10*3/uL (ref 0.7–3.1)
Lymphs: 20 %
MCH: 20.5 pg — ABNORMAL LOW (ref 26.6–33.0)
MCHC: 29.1 g/dL — ABNORMAL LOW (ref 31.5–35.7)
MCV: 70 fL — ABNORMAL LOW (ref 79–97)
Monocytes Absolute: 0.5 10*3/uL (ref 0.1–0.9)
Monocytes: 9 %
Neutrophils Absolute: 3.8 10*3/uL (ref 1.4–7.0)
Neutrophils: 67 %
Platelets: 128 10*3/uL — ABNORMAL LOW (ref 150–450)
RBC: 5.33 x10E6/uL — ABNORMAL HIGH (ref 3.77–5.28)
RDW: 20.5 % — ABNORMAL HIGH (ref 11.7–15.4)
WBC: 5.7 10*3/uL (ref 3.4–10.8)

## 2023-09-12 LAB — LIPID PANEL
Chol/HDL Ratio: 2.8 ratio (ref 0.0–4.4)
Cholesterol, Total: 122 mg/dL (ref 100–199)
HDL: 44 mg/dL (ref >39)
LDL Chol Calc (NIH): 64 mg/dL (ref 0–99)
Triglycerides: 65 mg/dL (ref 0–149)
VLDL Cholesterol Cal: 14 mg/dL (ref 5–40)

## 2023-09-12 LAB — THYROID PANEL
Free Thyroxine Index: 2.7 (ref 1.2–4.9)
T3 Uptake Ratio: 31 % (ref 24–39)
T4, Total: 8.8 ug/dL (ref 4.5–12.0)

## 2023-09-12 NOTE — Telephone Encounter (Signed)
 Result encounter updated

## 2023-09-12 NOTE — Telephone Encounter (Signed)
 Copied from CRM [id number]. Topic: Clinical - Lab/Test Results >> Sep 12, 2023  4:26 PM Ivette P wrote: Reason for CRM: pt called in about recent lab results. Missed a call read as follows :  "Anton Baton, NP 09/12/2023  1:21 PM EDT   Hemoglobin and hematocrit are low which are a sign of iron deficiency anemia, I would like to side start you on iron supplement. Ferrous sulfate 325 1 tab daily which she will be taking with vitamin C (orange juice) to increase absorption   All other labs are within normal ranges"    Pt understood no further questions.

## 2023-10-09 ENCOUNTER — Telehealth: Payer: Self-pay | Admitting: Nurse Practitioner

## 2023-10-17 NOTE — Progress Notes (Unsigned)
 NEUROLOGY FOLLOW UP OFFICE NOTE  Natalie Acevedo [id number]  Assessment/Plan:   Migraine without aura, without status migrainosus, not intractable Right hand weakness - MRI negative for stroke.  No pain or numbness to suggest radiculopathy/carpal tunnel syndrome.  No objective weakness on exam.  Unclear etiology.  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    Migraine prevention:  Aimovig  140mg  Q4wks Migraine rescue:  Ubrelvy  100mg  Limit use of pain relievers to no more than 9 days out of the month to prevent risk of rebound or medication-overuse headache. Keep headache diary Follow up in one year or sooner if needed. ***   Subjective:  Natalie Acevedo is a 53 year old woman who follows up for migraines.   UPDATE: Last seen on 11/04/2021  Migraines: Migraines have been stable. Intensity:  Moderate Duration:  1 hour Frequency:  2 days a week. Frequency of abortive medication: daily Current NSAIDS:  none Current analgesics:  none Current triptans:  none Current muscle relaxants:  Tizanidine 4mg  Current antihypertensive:  Losartan , furosemide  Current Antidepressant medications:  Lexapro , Wellbutrin  Current antiepileptic medication:  gabapentin 400mg  *** Current CGRP inhibitor:  Aimovig  140mg , Ubrelvy  100mg  Other medications:  quetiapine      HISTORY:  Migraines: Onset:  April 2020.  She reports feeling menopausal.  She is feeling more depressed and irritable.  Many years ago, she had minor daily headaches which responded to acupuncture. Location:  bifrontal Quality:  throbbing Initial tensity:  10/10.  She denies new headache, thunderclap headache  Aura:  no Premonitory Phase:  no Postdrome:  no Associated symptoms:  Sometimes nausea..  She denies associated vomiting, photophobia, phonophobia, visual disturbance or unilateral numbness or weakness. Initial duration:  Varies hour to several hours Initial frequency:  daily Initial frequency of abortive  medication: Excedrin Migraine daily.  Sumatriptan once in past month. Triggers:  Valsalva (coughting, sneezing) Relieving factors:  no Activity:  Aggravates. Sometimes dizziness and staggering.  Some memory issues.  Ocular migraine: Once in awhile she will see a black spot in the mid lower half of visual field only in the left eye lasting about 3 minutes.  No associated headache.  It has occurred just a handful of times.   Right hand weakness: In the summer 2022, she reported new right upper extremity weakness.   She will suddenly drop objects from her right hand.  No neck or radicular pain or numbness.No involvement of her leg.  Once in awhile she will see a black spot in the mid lower half of visual field only in the left eye lasting about 3 minutes.  No associated headache.  It has occurred about 3 or 4 times.  MRI of brain without contrast on 12/27/2020 was unremarkable.  Still may drop an object here and there.   Upper extremity twitching:    Imaging: 12/27/2020 MRI BRAIN WO:  Mildly motion degraded exam.  Unremarkable non-contrast MRI appearance of the brain. No evidence of acute intracranial abnormality. 10/15/2018 MRI BRAIN W WO:  Negative exam. No acute intracranial findings. No abnormal postcontrast enhancement.  No significant white matter signal abnormality is noted to suggest complicated migraine or chronic microvascular ischemic change.   Past medications: Past triptan:  Sumatriptan 50mg ; rizatriptan  10mg  Past antihypertensive:  HCTZ Past anticonvulsant:  topiramate  100mg ;     Smoker:  1  ppd Depression:  yes; Anxiety:  yes.  She says she is menopausal.  She is more irritable.  Endorses mood swings Family history of headache:  no  PAST MEDICAL HISTORY: Past Medical History:  Diagnosis Date   Anemia    Anxiety    Arthritis    Asthma    Depression    Hypertension     MEDICATIONS: Current Outpatient Medications on File Prior to Visit  Medication Sig Dispense Refill    AIMOVIG  140 MG/ML SOAJ INJECT 140MG  EVERY 28 DAYS 1 mL 5   albuterol  (VENTOLIN  HFA) 108 (90 Base) MCG/ACT inhaler Inhale 1-2 puffs into the lungs every 6 (six) hours as needed for wheezing or shortness of breath. 18 g 0   buprenorphine-naloxone (SUBOXONE) 8-2 mg SUBL SL tablet Place 1 tablet under the tongue.     buPROPion  ER (WELLBUTRIN  SR) 100 MG 12 hr tablet Take 1 tablet (100 mg total) by mouth 2 (two) times daily. 180 tablet 0   celecoxib  (CELEBREX ) 200 MG capsule Take 200 mg by mouth in the morning.     eletriptan  (RELPAX ) 40 MG tablet Take 1 tablet (40 mg total) by mouth as needed for migraine or headache. May repeat in 2 hours if headache persists or recurs. (Patient not taking: Reported on 06/24/2022) 10 tablet 5   escitalopram  (LEXAPRO ) 10 MG tablet Take 10 mg by mouth in the morning.     furosemide  (LASIX ) 20 MG tablet Take 1-tab for 3-days for BLE, 1-tab daily PRN for no greater that 7 days for BLE 30 tablet 0   gabapentin (NEURONTIN) 400 MG capsule Take 400 mg by mouth.     losartan  (COZAAR ) 25 MG tablet Take 1 tablet (25 mg total) by mouth daily. 90 tablet 0   QUEtiapine  (SEROQUEL ) 50 MG tablet Take 50 mg by mouth at bedtime.     tiZANidine (ZANAFLEX) 4 MG tablet Take 4 mg by mouth every 8 (eight) hours as needed for muscle spasms.  1   Ubrogepant  (UBRELVY ) 100 MG TABS Take 1 tablet (100 mg total) by mouth as needed (May repeat after 2 hours.  Maximum 2 tablets in 24 hours.). (Patient not taking: Reported on 09/11/2023) 10 tablet 5   No current facility-administered medications on file prior to visit.      ALLERGIES: Allergies  Allergen Reactions   Zestril [Lisinopril] Cough    FAMILY HISTORY: Family History  Problem Relation Age of Onset   Kidney disease Mother    Cancer Maternal Grandmother       Objective:  *** General: No acute distress.  Patient appears well-groomed.   Head:  Normocephalic/atraumatic Neck:  Supple.  No paraspinal tenderness.  Full range of  motion. Heart:  Regular rate and rhythm. Neuro:  Alert and oriented.  Speech fluent and not dysarthric.  Language intact.  CN II-XII intact.  Bulk and tone normal.  Muscle strength 5/5 throughout.  Sensation to light touch intact.  Deep tendon reflexes 2+ throughout, toes downgoing.  Gait normal.  Romberg negative.    Juliene Dunnings, DO  CC: Nena Cassis Morton Hummer, NP

## 2023-10-18 ENCOUNTER — Ambulatory Visit: Admitting: Neurology

## 2023-10-18 ENCOUNTER — Encounter: Payer: Self-pay | Admitting: Neurology

## 2023-10-18 VITALS — BP 91/52 | HR 88 | Ht 62.0 in | Wt 193.2 lb

## 2023-10-18 DIAGNOSIS — G43009 Migraine without aura, not intractable, without status migrainosus: Secondary | ICD-10-CM | POA: Diagnosis not present

## 2023-10-18 DIAGNOSIS — T50905A Adverse effect of unspecified drugs, medicaments and biological substances, initial encounter: Secondary | ICD-10-CM | POA: Diagnosis not present

## 2023-10-18 DIAGNOSIS — G253 Myoclonus: Secondary | ICD-10-CM

## 2023-10-18 MED ORDER — AIMOVIG 140 MG/ML ~~LOC~~ SOAJ: SUBCUTANEOUS | 11 refills | Status: AC

## 2023-10-18 MED ORDER — UBRELVY 100 MG PO TABS: 1.0000 | ORAL_TABLET | ORAL | 11 refills | Status: AC | PRN

## 2023-10-18 NOTE — Patient Instructions (Signed)
 Restart Aimovig  140mg  every 28 days - if no improvement in 3 months, then contact me Restart Ubrelvy  as needed/directed. Limit use of pain relievers to no more than 9 days out of the month to prevent risk of rebound or medication-overuse headache.  Try to avoid Goodys as much as possible Keep headache diary Follow up 8 months

## 2023-10-20 ENCOUNTER — Ambulatory Visit: Admitting: Pulmonary Disease

## 2023-10-20 ENCOUNTER — Telehealth: Payer: Self-pay

## 2023-10-20 ENCOUNTER — Encounter: Payer: Self-pay | Admitting: Pulmonary Disease

## 2023-10-20 ENCOUNTER — Other Ambulatory Visit: Payer: Self-pay

## 2023-10-20 VITALS — BP 125/79 | HR 80 | Ht 62.5 in | Wt 192.0 lb

## 2023-10-20 DIAGNOSIS — J4541 Moderate persistent asthma with (acute) exacerbation: Secondary | ICD-10-CM

## 2023-10-20 DIAGNOSIS — J9611 Chronic respiratory failure with hypoxia: Secondary | ICD-10-CM

## 2023-10-20 DIAGNOSIS — R600 Localized edema: Secondary | ICD-10-CM | POA: Diagnosis not present

## 2023-10-20 DIAGNOSIS — F17210 Nicotine dependence, cigarettes, uncomplicated: Secondary | ICD-10-CM

## 2023-10-20 DIAGNOSIS — R0602 Shortness of breath: Secondary | ICD-10-CM

## 2023-10-20 MED ORDER — TRELEGY ELLIPTA 100-62.5-25 MCG/ACT IN AEPB
INHALATION_SPRAY | RESPIRATORY_TRACT | Status: DC
Start: 2023-10-20 — End: 2023-11-23

## 2023-10-20 MED ORDER — TRELEGY ELLIPTA 100-62.5-25 MCG/ACT IN AEPB: 1.0000 | INHALATION_SPRAY | Freq: Every day | RESPIRATORY_TRACT | 5 refills | Status: DC

## 2023-10-20 MED ORDER — AZITHROMYCIN 250 MG PO TABS: ORAL_TABLET | ORAL | 0 refills | Status: DC

## 2023-10-20 MED ORDER — PREDNISONE 10 MG PO TABS
40.0000 mg | ORAL_TABLET | Freq: Every day | ORAL | 0 refills | Status: DC
Start: 2023-10-20 — End: 2024-01-24

## 2023-10-20 NOTE — Telephone Encounter (Signed)
 Copied from CRM [id number]. Topic: Clinical - Prescription Issue >> Oct 20, 2023  9:54 AM Isabell A wrote: Reason for CRM: [facility] the Trelegy need to be resent with the quantity of 60 blisters.

## 2023-10-20 NOTE — Addendum Note (Signed)
 Addended by: ARMAND SOR R on: 10/20/2023 10:25 AM   Modules accepted: Orders

## 2023-10-20 NOTE — Addendum Note (Signed)
 Addended by: ARMAND SOR R on: 10/20/2023 10:24 AM   Modules accepted: Orders

## 2023-10-20 NOTE — Patient Instructions (Addendum)
 Take lasix  2 tabs daily for the next 3 days for the leg swelling  We will refer you to cardiology for further evaluation  We will schedule you for breathing tests to evaluate for COPD as soon as we have an opening  Start trelegy ellipta 1 puff daily - rinse mouth out after each use  Use albuterol  inhaler or nebulizer treatment as needed  Recommend quitting smoking - use nicotine patch 14mg  daily - use mini nicotine lozenges as needed  Follow up in 3 months to review breathing test results

## 2023-10-20 NOTE — Progress Notes (Signed)
 Synopsis: Referred in June 2025 for shortness of breath  Subjective:   PATIENT ID: Natalie Acevedo GENDER: female DOB: May 19, 1970, MRN: [id number]  HPI  Chief Complaint  Patient presents with   Consult    Pt states SOB, swelling of feet x 1 month    Natalie Acevedo is a 53 year old woman, daily smoker with history of hypertension who is referred to pulmonary clinic for shortness of breath.   She experiences chronic shortness of breath, with home oxygen levels typically ranging between 75% and 85%. She frequently uses an albuterol  inhaler and nebulizer, especially during activity, but continues to struggle with dyspnea. Shortness of breath occurs with minimal exertion, such as walking short distances or ascending a small incline.  She reports swelling in both feet for about a month and a half, along with heart palpitations and lightheadedness. She takes Lasix  daily for the swelling without significant improvement. No chest pain is present.  She has been smoking since age 1 and currently smokes about a pack a day, although she had previously reduced to half a pack. She is attempting to quit smoking, citing stress as a barrier.   Past Medical History:  Diagnosis Date   Anemia    Anxiety    Arthritis    Asthma    Depression    Hypertension      Family History  Problem Relation Age of Onset   Kidney disease Mother    Cancer Maternal Grandmother      Social History   Socioeconomic History   Marital status: Divorced    Spouse name: Not on file   Number of children: Not on file   Years of education: Not on file   Highest education level: Not on file  Occupational History   Not on file  Tobacco Use   Smoking status: Every Day    Current packs/day: 0.50    Average packs/day: 0.5 packs/day for 30.0 years (15.0 ttl pk-yrs)    Types: Cigarettes   Smokeless tobacco: Never  Vaping Use   Vaping status: Never Used  Substance and Sexual Activity   Alcohol  use: Not Currently    Drug use: No   Sexual activity: Not on file  Other Topics Concern   Not on file  Social History Narrative   Right handed   Lives in single story home with sister and daughter   Drinks lots of caffine, coffe and energy drink, and 2-3 sodas a day   Social Drivers of Health   Financial Resource Strain: Low Risk  (01/02/2023)   Received from [facility]   Overall Financial Resource Strain (CARDIA)    Difficulty of Paying Living Expenses: Not hard at all  Food Insecurity: No Food Insecurity (09/04/2023)   Received from [facility]   Hunger Vital Sign    Within the past 12 months, you worried that your food would run out before you got the money to buy more.: Never true    Within the past 12 months, the food you bought just didn't last and you didn't have money to get more.: Never true  Transportation Needs: No Transportation Needs (09/04/2023)   Received from [facility]   PRAPARE - Transportation    Lack of Transportation (Medical): No    Lack of Transportation (Non-Medical): No  Physical Activity: Insufficiently Active (01/02/2023)   Received from [facility]   Exercise Vital Sign    On average, how many days per week do you  engage in moderate to strenuous exercise (like a brisk walk)?: 4 days    On average, how many minutes do you engage in exercise at this level?: 30 min  Stress: No Stress Concern Present (01/02/2023)   Received from [facility] - Occupational Stress Questionnaire    Feeling of Stress : Not at all  Social Connections: Not on file  Intimate Partner Violence: Not on file     Allergies  Allergen Reactions   Zestril [Lisinopril] Cough     Outpatient Medications Prior to Visit  Medication Sig Dispense Refill   albuterol  (PROVENTIL ) (2.5 MG/3ML) 0.083% nebulizer solution Take 2.5 mg by nebulization every 6 (six) hours as needed for wheezing or shortness of breath.     hydrochlorothiazide  (HYDRODIURIL ) 25 MG  tablet Take 25 mg by mouth daily.     albuterol  (VENTOLIN  HFA) 108 (90 Base) MCG/ACT inhaler Inhale 1-2 puffs into the lungs every 6 (six) hours as needed for wheezing or shortness of breath. 18 g 0   buprenorphine-naloxone (SUBOXONE) 8-2 mg SUBL SL tablet Place 1 tablet under the tongue.     buPROPion  ER (WELLBUTRIN  SR) 100 MG 12 hr tablet Take 1 tablet (100 mg total) by mouth 2 (two) times daily. 180 tablet 0   celecoxib  (CELEBREX ) 200 MG capsule Take 200 mg by mouth in the morning.     Erenumab -aooe (AIMOVIG ) 140 MG/ML SOAJ INJECT 140MG  EVERY 28 DAYS 1 mL 11   escitalopram  (LEXAPRO ) 10 MG tablet Take 10 mg by mouth in the morning.     furosemide  (LASIX ) 20 MG tablet Take 1-tab for 3-days for BLE, 1-tab daily PRN for no greater that 7 days for BLE 30 tablet 0   gabapentin (NEURONTIN) 400 MG capsule Take 400 mg by mouth.     losartan  (COZAAR ) 25 MG tablet Take 1 tablet (25 mg total) by mouth daily. 90 tablet 0   QUEtiapine  (SEROQUEL ) 50 MG tablet Take 50 mg by mouth at bedtime.     tiZANidine (ZANAFLEX) 4 MG tablet Take 4 mg by mouth every 8 (eight) hours as needed for muscle spasms.  1   Ubrogepant  (UBRELVY ) 100 MG TABS Take 1 tablet (100 mg total) by mouth as needed (May repeat after 2 hours.  Maximum 2 tablets in 24 hours.). 10 tablet 11   No facility-administered medications prior to visit.    Review of Systems  Constitutional:  Negative for chills, fever, malaise/fatigue and weight loss.  HENT:  Negative for congestion, sinus pain and sore throat.   Eyes: Negative.   Respiratory:  Positive for cough, sputum production, shortness of breath and wheezing. Negative for hemoptysis.   Cardiovascular:  Positive for leg swelling. Negative for chest pain, palpitations, orthopnea and claudication.  Gastrointestinal:  Negative for abdominal pain, heartburn, nausea and vomiting.  Genitourinary: Negative.   Musculoskeletal:  Negative for joint pain and myalgias.  Skin:  Negative for rash.   Neurological:  Negative for weakness.  Endo/Heme/Allergies: Negative.   Psychiatric/Behavioral: Negative.     Resting   Supplemental oxygen during test? No  Resting Heart Rate 86  Resting Sp02 91  Lap 1 (250 feet)   HR 100  02 Sat 90  Lap 2 (250 feet)   HR --  02 Sat --  Lap 3 (250 feet)   HR --  02 Sat --  Tech Comments: Patient O2 dropped to 87% stopped caught breath went back up to 91%  Objective:   Vitals:   10/20/23 0911  BP: 125/79  Pulse: 80  SpO2: 90%  Weight: 192 lb (87.1 kg)  Height: 5' 2.5 (1.588 m)     Physical Exam Constitutional:      General: She is not in acute distress.    Appearance: Normal appearance. She is obese.   Eyes:     General: No scleral icterus.    Conjunctiva/sclera: Conjunctivae normal.    Cardiovascular:     Rate and Rhythm: Normal rate and regular rhythm.  Pulmonary:     Breath sounds: Wheezing present. No rhonchi or rales.   Musculoskeletal:     Right lower leg: Edema present.     Left lower leg: Edema present.   Skin:    General: Skin is warm and dry.   Neurological:     General: No focal deficit present.     CBC    Component Value Date/Time   WBC 5.7 09/11/2023 1202   WBC 8.6 06/29/2022 0830   RBC 5.33 (H) 09/11/2023 1202   RBC 5.81 (H) 06/29/2022 0830   HGB 10.9 (L) 09/11/2023 1202   HCT 37.4 09/11/2023 1202   PLT 128 (L) 09/11/2023 1202   MCV 70 (L) 09/11/2023 1202   MCH 20.5 (L) 09/11/2023 1202   MCH 20.1 (L) 06/29/2022 0830   MCHC 29.1 (L) 09/11/2023 1202   MCHC 28.1 (L) 06/29/2022 0830   RDW 20.5 (H) 09/11/2023 1202   LYMPHSABS 1.2 09/11/2023 1202   MONOABS 0.7 06/29/2022 0830   EOSABS 0.2 09/11/2023 1202   BASOSABS 0.1 09/11/2023 1202     Chest imaging:  PFT:     No data to display          Labs:  Path:  Echo:  Heart Catheterization:     Assessment & Plan:   Chronic hypoxemic respiratory failure (HCC) - Plan: Pulse oximetry, overnight  Moderate persistent  asthma with acute exacerbation - Plan: Fluticasone-Umeclidin-Vilant (TRELEGY ELLIPTA) 100-62.5-25 MCG/ACT AEPB, predniSONE  (DELTASONE ) 10 MG tablet, azithromycin  (ZITHROMAX ) 250 MG tablet, Pulse oximetry, overnight  Lower extremity edema - Plan: Ambulatory referral to Cardiology  Shortness of breath - Plan: Ambulatory referral to Cardiology  Cigarette smoker - Plan: Ambulatory referral to Cardiology, Ambulatory Referral for Lung Cancer Scre  Discussion: Natalie Acevedo is a 53 year old woman, daily smoker with history of hypertension who is referred to pulmonary clinic for shortness of breath.   Possible Chronic obstructive pulmonary disease (COPD) vs Asthma 40+ pack year history of smoking with wheezing on exam.  - Initiate Trelegy inhaler, one puff daily. - Prescribe prednisone  40 mg daily for 5 days. - Prescribe azithromycin  (Z-Pak). - Provide samples of Trelegy and demonstrated usage.  Chronic respiratory failure She desaturated on sit/stand test today below 88%.  - Set up portable oxygen concentrator for use during activity and at night. - Order overnight oxygen test to be done on room air - Reassess oxygen needs in the future with the goal of discontinuing daytime use.  Cigarette Smoker Nicotine dependence with smoking since age 60, currently smoking about a pack a day. Stress-related increase in smoking. Discussed smoking cessation options for 3 minutes. - Recommend nicotine patches (14 mg) and nicotine lozenges to aid in smoking cessation. - Refer to lung cancer screening program for CT scans of the lungs.  Edema Bilateral lower extremity edema likely related to chronic hypoxemia stressing the heart. Currently on low-dose Lasix . - Increase Lasix  to two tablets daily for three days to reduce swelling. -  Refer to cardiology for further evaluation.  60 minutes spent on this visit reviewing records, direct patient care and completing orders/documentation.  Follow up in 3  months  Dorn Chill, MD [facility] Office: [phone]   Current Outpatient Medications:    albuterol  (PROVENTIL ) (2.5 MG/3ML) 0.083% nebulizer solution, Take 2.5 mg by nebulization every 6 (six) hours as needed for wheezing or shortness of breath., Disp: , Rfl:    azithromycin  (ZITHROMAX ) 250 MG tablet, Take as directed, Disp: 6 tablet, Rfl: 0   Fluticasone-Umeclidin-Vilant (TRELEGY ELLIPTA) 100-62.5-25 MCG/ACT AEPB, Inhale 1 puff into the lungs daily., Disp: 30 each, Rfl: 5   hydrochlorothiazide  (HYDRODIURIL ) 25 MG tablet, Take 25 mg by mouth daily., Disp: , Rfl:    predniSONE  (DELTASONE ) 10 MG tablet, Take 4 tablets (40 mg total) by mouth daily with breakfast., Disp: 20 tablet, Rfl: 0   albuterol  (VENTOLIN  HFA) 108 (90 Base) MCG/ACT inhaler, Inhale 1-2 puffs into the lungs every 6 (six) hours as needed for wheezing or shortness of breath., Disp: 18 g, Rfl: 0   buprenorphine-naloxone (SUBOXONE) 8-2 mg SUBL SL tablet, Place 1 tablet under the tongue., Disp: , Rfl:    buPROPion  ER (WELLBUTRIN  SR) 100 MG 12 hr tablet, Take 1 tablet (100 mg total) by mouth 2 (two) times daily., Disp: 180 tablet, Rfl: 0   celecoxib  (CELEBREX ) 200 MG capsule, Take 200 mg by mouth in the morning., Disp: , Rfl:    Erenumab -aooe (AIMOVIG ) 140 MG/ML SOAJ, INJECT 140MG  EVERY 28 DAYS, Disp: 1 mL, Rfl: 11   escitalopram  (LEXAPRO ) 10 MG tablet, Take 10 mg by mouth in the morning., Disp: , Rfl:    furosemide  (LASIX ) 20 MG tablet, Take 1-tab for 3-days for BLE, 1-tab daily PRN for no greater that 7 days for BLE, Disp: 30 tablet, Rfl: 0   gabapentin (NEURONTIN) 400 MG capsule, Take 400 mg by mouth., Disp: , Rfl:    losartan  (COZAAR ) 25 MG tablet, Take 1 tablet (25 mg total) by mouth daily., Disp: 90 tablet, Rfl: 0   QUEtiapine  (SEROQUEL ) 50 MG tablet, Take 50 mg by mouth at bedtime., Disp: , Rfl:    tiZANidine (ZANAFLEX) 4 MG tablet, Take 4 mg by mouth every 8 (eight) hours as needed for muscle  spasms., Disp: , Rfl: 1   Ubrogepant  (UBRELVY ) 100 MG TABS, Take 1 tablet (100 mg total) by mouth as needed (May repeat after 2 hours.  Maximum 2 tablets in 24 hours.)., Disp: 10 tablet, Rfl: 11

## 2023-10-23 ENCOUNTER — Telehealth: Payer: Self-pay

## 2023-10-23 NOTE — Telephone Encounter (Signed)
 Copied from CRM [id number]. Topic: Clinical - Prescription Issue >> Oct 23, 2023  1:27 PM Rozanna MATSU wrote: Reason for CRM: PT CALLED STATED SHE WOULD LIKE TO CANCEL THE OXYGEN PRESCRIPTION. STATED SHE WILL DO THIS ON HER OWN.

## 2023-10-31 ENCOUNTER — Telehealth: Payer: Self-pay

## 2023-10-31 NOTE — Telephone Encounter (Signed)
 Copied from CRM [id number]. Topic: Clinical - Medication Question >> Oct 31, 2023 10:34 AM Rozanna MATSU wrote: Reason for CRM: PT CALLED STATED SHE HAS NOT HEARD ANYTHING ABOUT CT SCAN, LUNG SCREENING AND THE APPT FOR THE HEART DOCTOR. PLEASE REACH OUT TO PT IN REGARDS TO THE ABOVE.  Spoke with patient regarding prior message. Advised patient the referral has been placed for the cardiologist and lung cancer screening . Patient's voice was understanding. Nothing  else further needed.

## 2023-11-20 ENCOUNTER — Telehealth: Payer: Self-pay

## 2023-11-20 DIAGNOSIS — F17210 Nicotine dependence, cigarettes, uncomplicated: Secondary | ICD-10-CM

## 2023-11-20 DIAGNOSIS — J4541 Moderate persistent asthma with (acute) exacerbation: Secondary | ICD-10-CM

## 2023-11-20 NOTE — Telephone Encounter (Signed)
*  Pulm  Pharmacy Patient Advocate Encounter   Received notification from CoverMyMeds that prior authorization for Trelegy Ellipta  100-62.5-25MCG/ACT aerosol powder  is required/requested.   Insurance verification completed.   The patient is insured through [facility].   Per test claim:  Trial and failure of TWO preferred alternatives is preferred by the insurance.  If suggested medication is appropriate, Please send in a new RX and discontinue this one. If not, please advise as to why it's not appropriate so that we may request a Prior Authorization. Please note, some preferred medications may still require a PA.  If the suggested medications have not been trialed and there are no contraindications to their use, the PA will not be submitted, as it will not be approved.   CMM Key: [id number]  Patient must try and fail TWO alternatives:   Advair Diskus, Advair HFA, Dulera, Symbicort

## 2023-11-20 NOTE — Telephone Encounter (Signed)
 Dr. Kara, please review PA info per pharm team  I do not see that the pt has tried covered alternative  Please advise, thanks!

## 2023-11-23 MED ORDER — FLUTICASONE-SALMETEROL 250-50 MCG/ACT IN AEPB: 1.0000 | INHALATION_SPRAY | Freq: Two times a day (BID) | RESPIRATORY_TRACT | 11 refills | Status: DC

## 2023-11-23 NOTE — Telephone Encounter (Signed)
 Advair diskus sent to pharmacy.  JD

## 2023-11-24 ENCOUNTER — Telehealth: Payer: Self-pay

## 2023-11-24 ENCOUNTER — Other Ambulatory Visit (HOSPITAL_COMMUNITY): Payer: Self-pay

## 2023-11-24 NOTE — Telephone Encounter (Signed)
 Pharmacy Patient Advocate Encounter   Received notification from CoverMyMeds that prior authorization for Fluticasone -Salmeterol 250-50MCG/ACT aerosol powder  is required/requested.   Insurance verification completed.   The patient is insured through [facility] .   Per test claim:  Brand Advair Diskus is preferred by the insurance.  If suggested medication is appropriate, Please send in a new RX and discontinue this one. If not, please advise as to why it's not appropriate so that we may request a Prior Authorization. Please note, some preferred medications may still require a PA.  If the suggested medications have not been trialed and there are no contraindications to their use, the PA will not be submitted, as it will not be approved.

## 2023-11-24 NOTE — Addendum Note (Signed)
 Addended by: KARNA CONSUELO PARAS on: 11/24/2023 08:42 AM   Modules accepted: Orders

## 2023-11-24 NOTE — Telephone Encounter (Signed)
 Spoke with patient regarding prior message.Advised patient Dr.Dewald has sent in Advair to patient's pharmacy. Patient stated she has not heard nothing from the lung cancer screening program.Advised patient I had placed a order for the lung cancer screening and the nurses will contact patient.  Patient's voice was understanding.Nothing else further needed.

## 2023-12-04 ENCOUNTER — Ambulatory Visit
Admission: RE | Admit: 2023-12-04 | Discharge: 2023-12-04 | Disposition: A | Discharge status: Home / Self Care | Source: Ambulatory Visit | Attending: Pulmonary Disease | Admitting: Pulmonary Disease

## 2023-12-04 DIAGNOSIS — F17210 Nicotine dependence, cigarettes, uncomplicated: Secondary | ICD-10-CM

## 2023-12-13 ENCOUNTER — Ambulatory Visit: Admitting: Nurse Practitioner

## 2023-12-21 ENCOUNTER — Other Ambulatory Visit: Payer: Self-pay | Admitting: Nurse Practitioner

## 2023-12-21 DIAGNOSIS — F331 Major depressive disorder, recurrent, moderate: Secondary | ICD-10-CM

## 2023-12-22 ENCOUNTER — Telehealth: Payer: Self-pay

## 2023-12-22 NOTE — Telephone Encounter (Signed)
 Copied from CRM #[id number]. Topic: Clinical - Lab/Test Results >> Dec 22, 2023  8:54 AM Devaughn RAMAN wrote: Reason for CRM: Patient called in regarding CT results. Patient wanted to inquire if she needs to be seen prior to her appointment and what her results were or if she can discuss them at her appointment on September 24th.   Spoke w/ patient the result have no been reviewed by Md and as soon they are someone will call and to please keep her f/u appt

## 2024-01-01 ENCOUNTER — Telehealth: Payer: Self-pay | Admitting: Neurology

## 2024-01-01 ENCOUNTER — Telehealth: Payer: Self-pay

## 2024-01-01 NOTE — Telephone Encounter (Signed)
 Pt called in today and stated that there need to be a prior approval for the prescription called: Erenumab -aooe (AIMOVIG ) 140 MG/ML SOAJ, so she can get her refill. Thanks

## 2024-01-02 ENCOUNTER — Ambulatory Visit: Payer: Self-pay | Admitting: Pulmonary Disease

## 2024-01-02 ENCOUNTER — Other Ambulatory Visit (HOSPITAL_COMMUNITY): Payer: Self-pay

## 2024-01-02 ENCOUNTER — Telehealth: Payer: Self-pay

## 2024-01-02 NOTE — Telephone Encounter (Signed)
 PA request has been Submitted. New Encounter has been or will be created for follow up. For additional info see Pharmacy Prior Auth telephone encounter from 01-02-2024.

## 2024-01-02 NOTE — Telephone Encounter (Signed)
 Pharmacy Patient Advocate Encounter   Received notification from Pt Calls Messages that prior authorization for Aimovig  140MG /ML auto-injectors is required/requested.   Insurance verification completed.   The patient is insured through [facility].   Per test claim: PA required; PA submitted to above mentioned insurance via Latent Key/confirmation #/EOC [id number] Status is pending

## 2024-01-04 NOTE — Telephone Encounter (Signed)
 Pharmacy Patient Advocate Encounter  Received notification from [employer] that Prior Authorization for Aimovig  140MG /ML auto-injectors has been APPROVED from 01-02-2024 to 04-02-2024   PA #/Case ID/Reference #: [id number]

## 2024-01-11 ENCOUNTER — Ambulatory Visit: Admitting: Internal Medicine

## 2024-01-11 NOTE — Progress Notes (Incomplete)
  Cardiology Office Note   Date:  01/11/2024  ID:  Natalie Acevedo, DOB 1970/07/11, MRN [id number] PCP: No primary care provider on file.  [facility] Providers Cardiologist:  None { Click to update primary MD,subspecialty MD or APP then REFRESH:1}    History of Present Illness Natalie Acevedo is a 53 y.o. female with a past medical history of hypertension, mild intermittent asthma, depression, obesity, tobacco abuse, chronic hypoxic respiratory failure, opioid abuse in remission who was referred by her pulmonologist, Dr. Kara, for complaints of dyspnea with minimal exertion, palpitations and lightheadedness and bilateral leg swelling.  At her visit on 10/20/2023 with Dr. Kara, she had noted bilateral lower extremity edema despite on low-dose Lasix  and her Lasix  was increased to 2 tablets daily for 3 days to help reduce swelling and referred to cardiology.  Today, ***  Tobacco use: *** Alcohol  use: *** Activity level: *** Diet mainly consists of: *** Family history: Family History  Problem Relation Age of Onset   Kidney disease Mother    Cancer Maternal Grandmother      ROS:  ROS  Physical Exam  Physical Exam  VS:  LMP 08/06/2016         Wt Readings from Last 3 Encounters:  10/20/23 192 lb (87.1 kg)  10/18/23 193 lb 3.2 oz (87.6 kg)  09/11/23 192 lb (87.1 kg)     EKG Interpretation Date/Time:    Ventricular Rate:    PR Interval:    QRS Duration:    QT Interval:    QTC Calculation:   R Axis:      Text Interpretation:      Studies Reviewed   ***     Risk Assessment/Calculations  {Does this patient have ATRIAL FIBRILLATION?:[phone]} No BP recorded.  {Refresh Note OR Click here to enter BP  :1}***        ASCVD risk score: The ASCVD Risk score (Arnett DK, et al., 2019) failed to calculate for the following reasons:   The valid total cholesterol range is 130 to 320 mg/dL   ASSESSMENT  *** *** *** *** *** *** *** *** *** *** ***  ***   Plan  *** Cardiac risk counseling and prevention recommendations: Heart healthy/Mediterranean diet with whole grains, fruits, vegetable, fish, lean meats, nuts, and olive oil. Limit salt. Moderate walking, 3-5 times/week for 30-50 minutes each session. Aim for at least 150 minutes.week. Goal should be pace of 3 miles/hour, or walking 1.5 miles in 30 minutes Avoidance of tobacco products. Avoid excess alcohol .  Follow up: ***     {Are you ordering a CV Procedure (e.g. stress test, cath, DCCV, TEE, etc)?   Press F2        :[id number]}    Signed, Emeline FORBES Calender, MD

## 2024-01-15 ENCOUNTER — Other Ambulatory Visit: Payer: Self-pay | Admitting: Nurse Practitioner

## 2024-01-15 DIAGNOSIS — F331 Major depressive disorder, recurrent, moderate: Secondary | ICD-10-CM

## 2024-01-17 ENCOUNTER — Ambulatory Visit: Admitting: Pulmonary Disease

## 2024-01-17 DIAGNOSIS — J4541 Moderate persistent asthma with (acute) exacerbation: Secondary | ICD-10-CM

## 2024-01-17 NOTE — Progress Notes (Deleted)
 Synopsis: Referred in June 2025 for shortness of breath  Subjective:   PATIENT ID: Natalie Acevedo GENDER: female DOB: July 27, 1970, MRN: [id number]  HPI  No chief complaint on file.  Natalie Acevedo is a 53 year old woman, daily smoker with history of hypertension who returns to pulmonary clinic for shortness of breath.   Initial OV 10/20/23 She experiences chronic shortness of breath, with home oxygen levels typically ranging between 75% and 85%. She frequently uses an albuterol  inhaler and nebulizer, especially during activity, but continues to struggle with dyspnea. Shortness of breath occurs with minimal exertion, such as walking short distances or ascending a small incline.  She reports swelling in both feet for about a month and a half, along with heart palpitations and lightheadedness. She takes Lasix  daily for the swelling without significant improvement. No chest pain is present.  She has been smoking since age 39 and currently smokes about a pack a day, although she had previously reduced to half a pack. She is attempting to quit smoking, citing stress as a barrier.  OV 01/17/24     Past Medical History:  Diagnosis Date   Anemia    Anxiety    Arthritis    Asthma    Depression    Hypertension      Family History  Problem Relation Age of Onset   Kidney disease Mother    Cancer Maternal Grandmother      Social History   Socioeconomic History   Marital status: Divorced    Spouse name: Not on file   Number of children: Not on file   Years of education: Not on file   Highest education level: Not on file  Occupational History   Not on file  Tobacco Use   Smoking status: Every Day    Current packs/day: 0.50    Average packs/day: 0.5 packs/day for 30.0 years (15.0 ttl pk-yrs)    Types: Cigarettes   Smokeless tobacco: Never  Vaping Use   Vaping status: Never Used  Substance and Sexual Activity   Alcohol  use: Not Currently   Drug use: No   Sexual activity: Not  on file  Other Topics Concern   Not on file  Social History Narrative   Right handed   Lives in single story home with sister and daughter   Drinks lots of caffine, coffe and energy drink, and 2-3 sodas a day   Social Drivers of Health   Financial Resource Strain: Low Risk  (01/02/2023)   Received from [facility]   Overall Financial Resource Strain (CARDIA)    Difficulty of Paying Living Expenses: Not hard at all  Food Insecurity: No Food Insecurity (09/04/2023)   Received from [facility]   Hunger Vital Sign    Within the past 12 months, you worried that your food would run out before you got the money to buy more.: Never true    Within the past 12 months, the food you bought just didn't last and you didn't have money to get more.: Never true  Transportation Needs: Unknown (09/04/2023)   Received from [facility] - Transportation    Lack of Transportation (Medical): Not on file    Lack of Transportation (Non-Medical): No  Physical Activity: Insufficiently Active (01/02/2023)   Received from [facility]   Exercise Vital Sign    On average, how many days per week do you engage in moderate to strenuous exercise (like a brisk walk)?: 4  days    On average, how many minutes do you engage in exercise at this level?: 30 min  Stress: No Stress Concern Present (01/02/2023)   Received from [facility] - Occupational Stress Questionnaire    Feeling of Stress : Not at all  Social Connections: Not on file  Intimate Partner Violence: Not on file     Allergies  Allergen Reactions   Zestril [Lisinopril] Cough     Outpatient Medications Prior to Visit  Medication Sig Dispense Refill   albuterol  (PROVENTIL ) (2.5 MG/3ML) 0.083% nebulizer solution Take 2.5 mg by nebulization every 6 (six) hours as needed for wheezing or shortness of breath.     albuterol  (VENTOLIN  HFA) 108 (90 Base) MCG/ACT inhaler Inhale 1-2 puffs into the  lungs every 6 (six) hours as needed for wheezing or shortness of breath. 18 g 0   azithromycin  (ZITHROMAX ) 250 MG tablet Take as directed 6 tablet 0   buprenorphine-naloxone (SUBOXONE) 8-2 mg SUBL SL tablet Place 1 tablet under the tongue.     buPROPion  ER (WELLBUTRIN  SR) 100 MG 12 hr tablet Take 1 tablet (100 mg total) by mouth 2 (two) times daily. 180 tablet 0   celecoxib  (CELEBREX ) 200 MG capsule Take 200 mg by mouth in the morning.     Erenumab -aooe (AIMOVIG ) 140 MG/ML SOAJ INJECT 140MG  EVERY 28 DAYS 1 mL 11   escitalopram  (LEXAPRO ) 10 MG tablet Take 10 mg by mouth in the morning.     fluticasone -salmeterol (ADVAIR DISKUS) 250-50 MCG/ACT AEPB Inhale 1 puff into the lungs in the morning and at bedtime. 60 each 11   furosemide  (LASIX ) 20 MG tablet Take 1-tab for 3-days for BLE, 1-tab daily PRN for no greater that 7 days for BLE 30 tablet 0   gabapentin (NEURONTIN) 400 MG capsule Take 400 mg by mouth.     hydrochlorothiazide  (HYDRODIURIL ) 25 MG tablet Take 25 mg by mouth daily.     losartan  (COZAAR ) 25 MG tablet Take 1 tablet (25 mg total) by mouth daily. 90 tablet 0   predniSONE  (DELTASONE ) 10 MG tablet Take 4 tablets (40 mg total) by mouth daily with breakfast. 20 tablet 0   QUEtiapine  (SEROQUEL ) 50 MG tablet Take 50 mg by mouth at bedtime.     tiZANidine (ZANAFLEX) 4 MG tablet Take 4 mg by mouth every 8 (eight) hours as needed for muscle spasms.  1   Ubrogepant  (UBRELVY ) 100 MG TABS Take 1 tablet (100 mg total) by mouth as needed (May repeat after 2 hours.  Maximum 2 tablets in 24 hours.). 10 tablet 11   No facility-administered medications prior to visit.    Review of Systems  Constitutional:  Negative for chills, fever, malaise/fatigue and weight loss.  HENT:  Negative for congestion, sinus pain and sore throat.   Eyes: Negative.   Respiratory:  Positive for cough, sputum production, shortness of breath and wheezing. Negative for hemoptysis.   Cardiovascular:  Positive for leg  swelling. Negative for chest pain, palpitations, orthopnea and claudication.  Gastrointestinal:  Negative for abdominal pain, heartburn, nausea and vomiting.  Genitourinary: Negative.   Musculoskeletal:  Negative for joint pain and myalgias.  Skin:  Negative for rash.  Neurological:  Negative for weakness.  Endo/Heme/Allergies: Negative.   Psychiatric/Behavioral: Negative.     Resting   Supplemental oxygen during test? No  Resting Heart Rate 86  Resting Sp02 91  Lap 1 (250 feet)   HR 100  02 Sat 90  Lap 2 (250 feet)   HR --  02 Sat --  Lap 3 (250 feet)   HR --  02 Sat --  Tech Comments: Patient O2 dropped to 87% stopped caught breath went back up to 91%     Objective:   There were no vitals filed for this visit.    Physical Exam Constitutional:      General: She is not in acute distress.    Appearance: Normal appearance. She is obese.  Eyes:     General: No scleral icterus.    Conjunctiva/sclera: Conjunctivae normal.  Cardiovascular:     Rate and Rhythm: Normal rate and regular rhythm.  Pulmonary:     Breath sounds: Wheezing present. No rhonchi or rales.  Musculoskeletal:     Right lower leg: Edema present.     Left lower leg: Edema present.  Skin:    General: Skin is warm and dry.  Neurological:     General: No focal deficit present.     CBC    Component Value Date/Time   WBC 5.7 09/11/2023 1202   WBC 8.6 06/29/2022 0830   RBC 5.33 (H) 09/11/2023 1202   RBC 5.81 (H) 06/29/2022 0830   HGB 10.9 (L) 09/11/2023 1202   HCT 37.4 09/11/2023 1202   PLT 128 (L) 09/11/2023 1202   MCV 70 (L) 09/11/2023 1202   MCH 20.5 (L) 09/11/2023 1202   MCH 20.1 (L) 06/29/2022 0830   MCHC 29.1 (L) 09/11/2023 1202   MCHC 28.1 (L) 06/29/2022 0830   RDW 20.5 (H) 09/11/2023 1202   LYMPHSABS 1.2 09/11/2023 1202   MONOABS 0.7 06/29/2022 0830   EOSABS 0.2 09/11/2023 1202   BASOSABS 0.1 09/11/2023 1202     Chest imaging:  PFT:     No data to display           Labs:  Path:  Echo:  Heart Catheterization:     Assessment & Plan:   Moderate persistent asthma with acute exacerbation  Discussion: Raea Divelbiss is a 53 year old woman, daily smoker with history of hypertension who is referred to pulmonary clinic for shortness of breath.   Possible Chronic obstructive pulmonary disease (COPD) vs Asthma 40+ pack year history of smoking with wheezing on exam.  - Initiate Trelegy inhaler, one puff daily. - Prescribe prednisone  40 mg daily for 5 days. - Prescribe azithromycin  (Z-Pak). - Provide samples of Trelegy and demonstrated usage.  Chronic respiratory failure She desaturated on sit/stand test today below 88%.  - Set up portable oxygen concentrator for use during activity and at night. - Order overnight oxygen test to be done on room air - Reassess oxygen needs in the future with the goal of discontinuing daytime use.  Cigarette Smoker Nicotine dependence with smoking since age 45, currently smoking about a pack a day. Stress-related increase in smoking. Discussed smoking cessation options for 3 minutes. - Recommend nicotine patches (14 mg) and nicotine lozenges to aid in smoking cessation. - Refer to lung cancer screening program for CT scans of the lungs.  Edema Bilateral lower extremity edema likely related to chronic hypoxemia stressing the heart. Currently on low-dose Lasix . - Increase Lasix  to two tablets daily for three days to reduce swelling. - Refer to cardiology for further evaluation.  60 minutes spent on this visit reviewing records, direct patient care and completing orders/documentation.  Follow up in 3 months  Dorn Chill, MD [facility] Office: [phone]   Current Outpatient Medications:  albuterol  (PROVENTIL ) (2.5 MG/3ML) 0.083% nebulizer solution, Take 2.5 mg by nebulization every 6 (six) hours as needed for wheezing or shortness of breath., Disp: , Rfl:    albuterol   (VENTOLIN  HFA) 108 (90 Base) MCG/ACT inhaler, Inhale 1-2 puffs into the lungs every 6 (six) hours as needed for wheezing or shortness of breath., Disp: 18 g, Rfl: 0   azithromycin  (ZITHROMAX ) 250 MG tablet, Take as directed, Disp: 6 tablet, Rfl: 0   buprenorphine-naloxone (SUBOXONE) 8-2 mg SUBL SL tablet, Place 1 tablet under the tongue., Disp: , Rfl:    buPROPion  ER (WELLBUTRIN  SR) 100 MG 12 hr tablet, Take 1 tablet (100 mg total) by mouth 2 (two) times daily., Disp: 180 tablet, Rfl: 0   celecoxib  (CELEBREX ) 200 MG capsule, Take 200 mg by mouth in the morning., Disp: , Rfl:    Erenumab -aooe (AIMOVIG ) 140 MG/ML SOAJ, INJECT 140MG  EVERY 28 DAYS, Disp: 1 mL, Rfl: 11   escitalopram  (LEXAPRO ) 10 MG tablet, Take 10 mg by mouth in the morning., Disp: , Rfl:    fluticasone -salmeterol (ADVAIR DISKUS) 250-50 MCG/ACT AEPB, Inhale 1 puff into the lungs in the morning and at bedtime., Disp: 60 each, Rfl: 11   furosemide  (LASIX ) 20 MG tablet, Take 1-tab for 3-days for BLE, 1-tab daily PRN for no greater that 7 days for BLE, Disp: 30 tablet, Rfl: 0   gabapentin (NEURONTIN) 400 MG capsule, Take 400 mg by mouth., Disp: , Rfl:    hydrochlorothiazide  (HYDRODIURIL ) 25 MG tablet, Take 25 mg by mouth daily., Disp: , Rfl:    losartan  (COZAAR ) 25 MG tablet, Take 1 tablet (25 mg total) by mouth daily., Disp: 90 tablet, Rfl: 0   predniSONE  (DELTASONE ) 10 MG tablet, Take 4 tablets (40 mg total) by mouth daily with breakfast., Disp: 20 tablet, Rfl: 0   QUEtiapine  (SEROQUEL ) 50 MG tablet, Take 50 mg by mouth at bedtime., Disp: , Rfl:    tiZANidine (ZANAFLEX) 4 MG tablet, Take 4 mg by mouth every 8 (eight) hours as needed for muscle spasms., Disp: , Rfl: 1   Ubrogepant  (UBRELVY ) 100 MG TABS, Take 1 tablet (100 mg total) by mouth as needed (May repeat after 2 hours.  Maximum 2 tablets in 24 hours.)., Disp: 10 tablet, Rfl: 11

## 2024-01-24 ENCOUNTER — Ambulatory Visit: Admitting: Pulmonary Disease

## 2024-01-24 ENCOUNTER — Encounter: Payer: Self-pay | Admitting: Pulmonary Disease

## 2024-01-24 VITALS — BP 125/82 | HR 79 | Ht 62.0 in | Wt 187.0 lb

## 2024-01-24 DIAGNOSIS — R918 Other nonspecific abnormal finding of lung field: Secondary | ICD-10-CM | POA: Diagnosis not present

## 2024-01-24 DIAGNOSIS — F17210 Nicotine dependence, cigarettes, uncomplicated: Secondary | ICD-10-CM | POA: Diagnosis not present

## 2024-01-24 DIAGNOSIS — J9610 Chronic respiratory failure, unspecified whether with hypoxia or hypercapnia: Secondary | ICD-10-CM | POA: Diagnosis not present

## 2024-01-24 DIAGNOSIS — J432 Centrilobular emphysema: Secondary | ICD-10-CM

## 2024-01-24 DIAGNOSIS — J4541 Moderate persistent asthma with (acute) exacerbation: Secondary | ICD-10-CM

## 2024-01-24 MED ORDER — FLUTICASONE-SALMETEROL 250-50 MCG/ACT IN AEPB
1.0000 | INHALATION_SPRAY | Freq: Two times a day (BID) | RESPIRATORY_TRACT | 11 refills | Status: AC
Start: 2024-01-24 — End: ?

## 2024-01-24 NOTE — Patient Instructions (Addendum)
 Use advair diskus 250-50mcg 1 puff twice daily - rinse mouth out after each use  Use albuterol  inhaler 1-2 puffs every 4-6 hours as needed  Call 1-800-quit-NOW to get free nicotine replacement and counseling from the state of [place]    Recommend quitting smoking with nicotine replacement - use nicotine patches 14-21mg  daily - use 2mg  mini nicotine lozenges as needed  Your CT scan has small nodules noted on it, will plan to repeat scan in February 2026  Schedule breathing tests at front desk  Follow up in 5 months after CT Chest scan

## 2024-01-24 NOTE — Progress Notes (Signed)
 Synopsis: Referred in June 2025 for shortness of breath  Subjective:   PATIENT ID: Natalie Acevedo Chamber GENDER: female DOB: March 31, 1971, MRN: [id number]  HPI  Chief Complaint  Patient presents with   Medical Management of Chronic Issues    PT states congestion x 2 weeks    Natalie Acevedo is a 53 year old woman, daily smoker with history of hypertension who returns to pulmonary clinic for centrilobular emphysema.   Initial OV 10/20/23 She experiences chronic shortness of breath, with home oxygen levels typically ranging between 75% and 85%. She frequently uses an albuterol  inhaler and nebulizer, especially during activity, but continues to struggle with dyspnea. Shortness of breath occurs with minimal exertion, such as walking short distances or ascending a small incline.  She reports swelling in both feet for about a month and a half, along with heart palpitations and lightheadedness. She takes Lasix  daily for the swelling without significant improvement. No chest pain is present.  She has been smoking since age 35 and currently smokes about a pack a day, although she had previously reduced to half a pack. She is attempting to quit smoking, citing stress as a barrier.  OV 01/17/24 She is enrolled in a lung cancer screening program and her recent CT scan revealed pulmonary nodules. A follow-up scan is scheduled for February. She has moderate centrilobular emphysema noted on CT Chest. She canceled an oxygen order, feeling unprepared to use it.   She has been experiencing wheezing over the past few weeks, attributing it to a recent illness. She notes that recovery from illnesses has become more difficult with age. She is not using Advair due to lack of prior approval from [facility] and uses albuterol  and a nebulizer instead. Attempts to obtain Trelegy have faced similar insurance issues.  She is attempting to quit smoking, reducing her intake to half a pack or two to three cigarettes a day. She  acknowledges that quitting would improve her breathing.   Past Medical History:  Diagnosis Date   Anemia    Anxiety    Arthritis    Asthma    Depression    Hypertension      Family History  Problem Relation Age of Onset   Kidney disease Mother    Cancer Maternal Grandmother      Social History   Socioeconomic History   Marital status: Divorced    Spouse name: Not on file   Number of children: Not on file   Years of education: Not on file   Highest education level: Not on file  Occupational History   Not on file  Tobacco Use   Smoking status: Every Day    Current packs/day: 0.50    Average packs/day: 0.5 packs/day for 30.0 years (15.0 ttl pk-yrs)    Types: Cigarettes   Smokeless tobacco: Never  Vaping Use   Vaping status: Never Used  Substance and Sexual Activity   Alcohol  use: Not Currently   Drug use: No   Sexual activity: Not on file  Other Topics Concern   Not on file  Social History Narrative   Right handed   Lives in single story home with sister and daughter   Drinks lots of caffine, coffe and energy drink, and 2-3 sodas a day   Social Drivers of Health   Financial Resource Strain: Low Risk  (01/02/2023)   Received from [facility]   Overall Financial Resource Strain (CARDIA)    Difficulty of Paying Living Expenses: Not hard at  all  Food Insecurity: No Food Insecurity (09/04/2023)   Received from [facility]   Hunger Vital Sign    Within the past 12 months, you worried that your food would run out before you got the money to buy more.: Never true    Within the past 12 months, the food you bought just didn't last and you didn't have money to get more.: Never true  Transportation Needs: Unknown (09/04/2023)   Received from [facility] - Transportation    Lack of Transportation (Medical): Not on file    Lack of Transportation (Non-Medical): No  Physical Activity: Insufficiently Active (01/02/2023)   Received from [facility]    Exercise Vital Sign    On average, how many days per week do you engage in moderate to strenuous exercise (like a brisk walk)?: 4 days    On average, how many minutes do you engage in exercise at this level?: 30 min  Stress: No Stress Concern Present (01/02/2023)   Received from [facility] - Occupational Stress Questionnaire    Feeling of Stress : Not at all  Social Connections: Not on file  Intimate Partner Violence: Not on file     Allergies  Allergen Reactions   Zestril [Lisinopril] Cough     Outpatient Medications Prior to Visit  Medication Sig Dispense Refill   albuterol  (PROVENTIL ) (2.5 MG/3ML) 0.083% nebulizer solution Take 2.5 mg by nebulization every 6 (six) hours as needed for wheezing or shortness of breath.     albuterol  (VENTOLIN  HFA) 108 (90 Base) MCG/ACT inhaler Inhale 1-2 puffs into the lungs every 6 (six) hours as needed for wheezing or shortness of breath. 18 g 0   azithromycin  (ZITHROMAX ) 250 MG tablet Take as directed 6 tablet 0   buprenorphine-naloxone (SUBOXONE) 8-2 mg SUBL SL tablet Place 1 tablet under the tongue.     buPROPion  ER (WELLBUTRIN  SR) 100 MG 12 hr tablet Take 1 tablet (100 mg total) by mouth 2 (two) times daily. 180 tablet 0   celecoxib  (CELEBREX ) 200 MG capsule Take 200 mg by mouth in the morning.     Erenumab -aooe (AIMOVIG ) 140 MG/ML SOAJ INJECT 140MG  EVERY 28 DAYS 1 mL 11   escitalopram  (LEXAPRO ) 10 MG tablet Take 10 mg by mouth in the morning.     fluticasone -salmeterol (ADVAIR DISKUS) 250-50 MCG/ACT AEPB Inhale 1 puff into the lungs in the morning and at bedtime. 60 each 11   gabapentin (NEURONTIN) 400 MG capsule Take 400 mg by mouth.     hydrochlorothiazide  (HYDRODIURIL ) 25 MG tablet Take 25 mg by mouth daily.     losartan  (COZAAR ) 25 MG tablet Take 1 tablet (25 mg total) by mouth daily. 90 tablet 0   predniSONE  (DELTASONE ) 10 MG tablet Take 4 tablets (40 mg total) by mouth daily with breakfast. 20  tablet 0   QUEtiapine  (SEROQUEL ) 50 MG tablet Take 50 mg by mouth at bedtime.     tiZANidine (ZANAFLEX) 4 MG tablet Take 4 mg by mouth every 8 (eight) hours as needed for muscle spasms.  1   Ubrogepant  (UBRELVY ) 100 MG TABS Take 1 tablet (100 mg total) by mouth as needed (May repeat after 2 hours.  Maximum 2 tablets in 24 hours.). 10 tablet 11   furosemide  (LASIX ) 20 MG tablet Take 1-tab for 3-days for BLE, 1-tab daily PRN for no greater that 7 days for BLE 30 tablet 0   No  facility-administered medications prior to visit.    Review of Systems  Constitutional:  Negative for chills, fever, malaise/fatigue and weight loss.  HENT:  Negative for congestion, sinus pain and sore throat.   Eyes: Negative.   Respiratory:  Positive for cough, sputum production, shortness of breath and wheezing. Negative for hemoptysis.   Cardiovascular:  Negative for chest pain, palpitations, orthopnea, claudication and leg swelling.  Gastrointestinal:  Negative for abdominal pain, heartburn, nausea and vomiting.  Genitourinary: Negative.   Musculoskeletal:  Negative for joint pain and myalgias.  Skin:  Negative for rash.  Neurological:  Negative for weakness.  Endo/Heme/Allergies: Negative.   Psychiatric/Behavioral: Negative.      Objective:   Vitals:   01/24/24 1306  BP: 125/82  Pulse: 79  SpO2: 94%  Weight: 187 lb (84.8 kg)  Height: 5' 2 (1.575 m)   Physical Exam Constitutional:      General: She is not in acute distress.    Appearance: Normal appearance. She is obese.  Eyes:     General: No scleral icterus.    Conjunctiva/sclera: Conjunctivae normal.  Cardiovascular:     Rate and Rhythm: Normal rate and regular rhythm.  Pulmonary:     Breath sounds: No wheezing, rhonchi or rales.  Musculoskeletal:     Right lower leg: No edema.     Left lower leg: No edema.     CBC    Component Value Date/Time   WBC 5.7 09/11/2023 1202   WBC 8.6 06/29/2022 0830   RBC 5.33 (H) 09/11/2023 1202   RBC  5.81 (H) 06/29/2022 0830   HGB 10.9 (L) 09/11/2023 1202   HCT 37.4 09/11/2023 1202   PLT 128 (L) 09/11/2023 1202   MCV 70 (L) 09/11/2023 1202   MCH 20.5 (L) 09/11/2023 1202   MCH 20.1 (L) 06/29/2022 0830   MCHC 29.1 (L) 09/11/2023 1202   MCHC 28.1 (L) 06/29/2022 0830   RDW 20.5 (H) 09/11/2023 1202   LYMPHSABS 1.2 09/11/2023 1202   MONOABS 0.7 06/29/2022 0830   EOSABS 0.2 09/11/2023 1202   BASOSABS 0.1 09/11/2023 1202      Latest Ref Rng & Units 09/11/2023   12:02 PM 06/29/2022    8:20 AM 10/15/2018   10:26 AM  BMP  Glucose 70 - 99 mg/dL 90  870    BUN 6 - 24 mg/dL 17  11    Creatinine 9.42 - 1.00 mg/dL 9.19  9.23  9.29   BUN/Creat Ratio 9 - 23 21     Sodium 134 - 144 mmol/L 141  142    Potassium 3.5 - 5.2 mmol/L 3.7  3.7    Chloride 96 - 106 mmol/L 97  101    CO2 20 - 29 mmol/L 30  30    Calcium 8.7 - 10.2 mg/dL 9.5  9.4     Chest imaging: CT Chest LCS 12/04/23 Lung-RADS 3, probably benign findings. Short-term follow-up in 6 months is recommended with repeat low-dose chest CT without contrast (please use the following order, CT CHEST LCS NODULE FOLLOW-UP W/O CM). Right lower lobe nodule or area of scarring is pleural-based at 6.6 mm.   Left adrenal adenoma . In the absence of clinically indicated signs/symptoms require(s) no independent follow-up.   Pulmonary artery enlargement suggests pulmonary arterial hypertension.   Aortic Atherosclerosis (ICD10-I70.0).  PFT:     No data to display          Labs:  Path:  Echo:  Heart Catheterization:     Assessment &  Plan:   Moderate persistent asthma with acute exacerbation  Discussion: Ngina Nugent is a 53 year old woman, daily smoker with history of hypertension who returns to pulmonary clinic for centrilobular emphysema.   Moderate centrilobular emphysema Moderate emphysema on CT. Emphasized smoking cessation for lung preservation. - Order pulmonary function tests. - Resend prescription for Advair  Diskus. - Consider Spiriva or Incruse if symptoms persist after Advair. - Encourage smoking cessation.  Pulmonary nodules Nodules on CT require six-month follow-up due to size and features. - Schedule follow-up CT in February. - Arrange post-CT visit to discuss results and biopsy need if growth observed.  Tobacco use disorder Ongoing smoking with recent reduction. Discussed cessation benefits for respiratory improvement. Smoking cessation discussed for 3 minutes. - Encourage continued smoking reduction and cessation. - use nicotine patches 14-21mg  daily - use 2mg  mini nicotine lozenges as needed  Chronic respiratory failure She desaturated on sit/stand test during last visit  - patient refused portable oxygen concentrator order after last visit, she does not wish to be on oxygen at this time  Follow up in 5 months  Dorn Chill, MD [facility] Office: [phone]   Current Outpatient Medications:    albuterol  (PROVENTIL ) (2.5 MG/3ML) 0.083% nebulizer solution, Take 2.5 mg by nebulization every 6 (six) hours as needed for wheezing or shortness of breath., Disp: , Rfl:    albuterol  (VENTOLIN  HFA) 108 (90 Base) MCG/ACT inhaler, Inhale 1-2 puffs into the lungs every 6 (six) hours as needed for wheezing or shortness of breath., Disp: 18 g, Rfl: 0   azithromycin  (ZITHROMAX ) 250 MG tablet, Take as directed, Disp: 6 tablet, Rfl: 0   buprenorphine-naloxone (SUBOXONE) 8-2 mg SUBL SL tablet, Place 1 tablet under the tongue., Disp: , Rfl:    buPROPion  ER (WELLBUTRIN  SR) 100 MG 12 hr tablet, Take 1 tablet (100 mg total) by mouth 2 (two) times daily., Disp: 180 tablet, Rfl: 0   celecoxib  (CELEBREX ) 200 MG capsule, Take 200 mg by mouth in the morning., Disp: , Rfl:    Erenumab -aooe (AIMOVIG ) 140 MG/ML SOAJ, INJECT 140MG  EVERY 28 DAYS, Disp: 1 mL, Rfl: 11   escitalopram  (LEXAPRO ) 10 MG tablet, Take 10 mg by mouth in the morning., Disp: , Rfl:    fluticasone -salmeterol  (ADVAIR DISKUS) 250-50 MCG/ACT AEPB, Inhale 1 puff into the lungs in the morning and at bedtime., Disp: 60 each, Rfl: 11   gabapentin (NEURONTIN) 400 MG capsule, Take 400 mg by mouth., Disp: , Rfl:    hydrochlorothiazide  (HYDRODIURIL ) 25 MG tablet, Take 25 mg by mouth daily., Disp: , Rfl:    losartan  (COZAAR ) 25 MG tablet, Take 1 tablet (25 mg total) by mouth daily., Disp: 90 tablet, Rfl: 0   predniSONE  (DELTASONE ) 10 MG tablet, Take 4 tablets (40 mg total) by mouth daily with breakfast., Disp: 20 tablet, Rfl: 0   QUEtiapine  (SEROQUEL ) 50 MG tablet, Take 50 mg by mouth at bedtime., Disp: , Rfl:    tiZANidine (ZANAFLEX) 4 MG tablet, Take 4 mg by mouth every 8 (eight) hours as needed for muscle spasms., Disp: , Rfl: 1   Ubrogepant  (UBRELVY ) 100 MG TABS, Take 1 tablet (100 mg total) by mouth as needed (May repeat after 2 hours.  Maximum 2 tablets in 24 hours.)., Disp: 10 tablet, Rfl: 11   furosemide  (LASIX ) 20 MG tablet, Take 1-tab for 3-days for BLE, 1-tab daily PRN for no greater that 7 days for BLE, Disp: 30 tablet, Rfl: 0

## 2024-01-30 ENCOUNTER — Encounter

## 2024-02-20 ENCOUNTER — Telehealth: Payer: Self-pay | Admitting: Pharmacy Technician

## 2024-02-20 NOTE — Telephone Encounter (Signed)
 Pharmacy Patient Advocate Encounter   Received notification from CoverMyMeds that prior authorization for UBRELVY  100MG  is required/requested.   Insurance verification completed.   The patient is insured through [facility] MEDICAID.   Per test claim: PA required; PA submitted to above mentioned insurance via Latent Key/confirmation #/EOC [id number] Status is pending

## 2024-02-21 ENCOUNTER — Encounter

## 2024-02-22 ENCOUNTER — Ambulatory Visit (INDEPENDENT_AMBULATORY_CARE_PROVIDER_SITE_OTHER)

## 2024-02-22 DIAGNOSIS — J432 Centrilobular emphysema: Secondary | ICD-10-CM | POA: Diagnosis not present

## 2024-02-22 LAB — PULMONARY FUNCTION TEST
DL/VA % pred: 96 %
DL/VA: 4.16 ml/min/mmHg/L
DLCO unc % pred: 86 %
DLCO unc: 16.58 ml/min/mmHg
FEF 25-75 Post: 1.17 L/s
FEF 25-75 Pre: 0.85 L/s
FEF2575-%Change-Post: 36 %
FEF2575-%Pred-Post: 47 %
FEF2575-%Pred-Pre: 34 %
FEV1-%Change-Post: 6 %
FEV1-%Pred-Post: 72 %
FEV1-%Pred-Pre: 67 %
FEV1-Post: 1.81 L
FEV1-Pre: 1.69 L
FEV1FVC-%Change-Post: 1 %
FEV1FVC-%Pred-Pre: 81 %
FEV6-%Change-Post: 5 %
FEV6-%Pred-Post: 88 %
FEV6-%Pred-Pre: 84 %
FEV6-Post: 2.74 L
FEV6-Pre: 2.61 L
FEV6FVC-%Change-Post: 0 %
FEV6FVC-%Pred-Post: 102 %
FEV6FVC-%Pred-Pre: 101 %
FVC-%Change-Post: 5 %
FVC-%Pred-Post: 86 %
FVC-%Pred-Pre: 82 %
FVC-Post: 2.78 L
FVC-Pre: 2.64 L
Post FEV1/FVC ratio: 65 %
Post FEV6/FVC ratio: 99 %
Pre FEV1/FVC ratio: 64 %
Pre FEV6/FVC Ratio: 99 %
RV % pred: 171 %
RV: 3.07 L
TLC % pred: 119 %
TLC: 5.68 L

## 2024-02-22 NOTE — Patient Instructions (Signed)
 Full PFT performed today.

## 2024-02-22 NOTE — Progress Notes (Signed)
 Full PFT performed today.

## 2024-02-23 ENCOUNTER — Other Ambulatory Visit (HOSPITAL_COMMUNITY): Payer: Self-pay

## 2024-02-23 NOTE — Telephone Encounter (Signed)
 Pharmacy Patient Advocate Encounter  Received notification from [employer] that Prior Authorization for UBRELVY  100MG  has been APPROVED from 10.28.25 to 10.28.26. Ran test claim, Copay is $4. This test claim was processed through [facility]- copay amounts may vary at other pharmacies due to pharmacy/plan contracts, or as the patient moves through the different stages of their insurance plan.   PA #/Case ID/Reference #: [id number]

## 2024-03-06 ENCOUNTER — Ambulatory Visit: Attending: Internal Medicine

## 2024-03-06 ENCOUNTER — Ambulatory Visit: Attending: Student in an Organized Health Care Education/Training Program | Admitting: Internal Medicine

## 2024-03-06 ENCOUNTER — Encounter: Payer: Self-pay | Admitting: Internal Medicine

## 2024-03-06 VITALS — BP 150/80 | HR 77 | Ht 62.0 in | Wt 193.0 lb

## 2024-03-06 DIAGNOSIS — R002 Palpitations: Secondary | ICD-10-CM | POA: Insufficient documentation

## 2024-03-06 NOTE — Progress Notes (Signed)
 Cardiology Office Note   Date:  03/06/2024  ID:  ZIA NAJERA, DOB [birth year], MRN [id number] PCP: Pcp, No  [facility] Providers Cardiologist:  None     History of Present Illness Natalie Acevedo is a 53 y.o. female with past medical history of tobacco use, asthma, chronic hypoxic respiratory failure and follows with pulmonology who was referred by Dr. Kara from pulmonology for lower extremity swelling which was thought to be due to chronic hypoxemia stressing the heart.  She has been on low-dose Lasix  which was increased at her pulmonology visit in June to 2 tablets x 3 days.  Today, she states that she has been having palpitations that occur several times per week.  They only last for a few seconds but she does have some lightheadedness and states that she feels high.  She has never passed out from this.  No known family history of any cardiac arrhythmias.  She also states that last night she had chest discomfort that felt like an achiness.  This occurred while she was laying down.  No radiation or diaphoresis.  This has not occurred in the setting of exertion.  Regarding her swelling, she states that since she took Lasix  back in June her swelling has not recurred.  At that time however she states that she had to increase her shoe size by 3 sizes because her legs were so swollen.  She denies any orthopnea, PND or any other complaints at this time other than those stated above.  Tobacco use: 5 cigarettes/day.  She is decreasing and was previous 2 pack/day smoker    ROS:  Review of Systems  All other systems reviewed and are negative.   Physical Exam  Physical Exam Vitals and nursing note reviewed.  Constitutional:      Appearance: Normal appearance.  HENT:     Head: Normocephalic and atraumatic.  Eyes:     Conjunctiva/sclera: Conjunctivae normal.  Neck:     Vascular: No carotid bruit.  Cardiovascular:     Rate and Rhythm: Normal rate and regular rhythm.   Pulmonary:     Effort: Pulmonary effort is normal.     Breath sounds: Normal breath sounds.  Musculoskeletal:        General: No swelling or tenderness.  Skin:    Coloration: Skin is not jaundiced or pale.  Neurological:     Mental Status: She is alert.     VS:  BP (!) 150/80   Pulse 77   Ht 5' 2 (1.575 m)   Wt 193 lb (87.5 kg)   LMP 08/06/2016   SpO2 94%   BMI 35.30 kg/m         Wt Readings from Last 3 Encounters:  03/06/24 193 lb (87.5 kg)  01/24/24 187 lb (84.8 kg)  10/20/23 192 lb (87.1 kg)     EKG Interpretation Date/Time:    Ventricular Rate:    PR Interval:    QRS Duration:    QT Interval:    QTC Calculation:   R Axis:      Text Interpretation:      Studies Reviewed         Risk Assessment/Calculations    ASCVD risk score: The ASCVD Risk score (Arnett DK, et al., 2019) failed to calculate for the following reasons:   The valid total cholesterol range is 130 to 320 mg/dL   ASSESSMENT  Palpitations Lower extremity swelling currently without any swelling today but had significant edema noted at her  visit in June with pulmonology that resolved with Lasix .  No known heart failure diagnosis Hypertension elevated initially.  Currently on hydrochlorothiazide  and losartan .  Management per CCP Tobacco use she is trying to wean off of cigarettes and doing well Chronic hypoxemic respiratory failure currently tolerating room air.  Follows with pulmonology Noncardiac chest pain occurred 1 time while at rest without radiation.  Does not have symptoms with exertion   Plan  3-day Zio patch Echocardiogram Encouraged continued tobacco cessation/weaning off Cardiac risk counseling and prevention recommendations: Heart healthy/Mediterranean diet with whole grains, fruits, vegetable, fish, lean meats, nuts, and olive oil. Limit salt. Moderate walking, 3-5 times/week for 30-50 minutes each session. Aim for at least 150 minutes.week. Goal should be pace of  3 miles/hour, or walking 1.5 miles in 30 minutes Avoidance of tobacco products. Avoid excess alcohol .  Follow up: 3 months          Signed, Emeline FORBES Calender, MD

## 2024-03-06 NOTE — Patient Instructions (Signed)
 Medication Instructions:  No medication changes were made at this visit. Continue current regimen.   Testing/Procedures: Your physician has requested that you have an echocardiogram. Echocardiography is a painless test that uses sound waves to create images of your heart. It provides your doctor with information about the size and shape of your heart and how well your heart's chambers and valves are working. This procedure takes approximately one hour. There are no restrictions for this procedure. Please do NOT wear cologne, perfume, aftershave, or lotions (deodorant is allowed). Please arrive 15 minutes prior to your appointment time.  Please note: We ask at that you not bring children with you during ultrasound (echo/ vascular) testing. Due to room size and safety concerns, children are not allowed in the ultrasound rooms during exams. Our front office staff cannot provide observation of children in our lobby area while testing is being conducted. An adult accompanying a patient to their appointment will only be allowed in the ultrasound room at the discretion of the ultrasound technician under special circumstances. We apologize for any inconvenience.  Your physician has requested that you wear a Zio heart monitor for 3 days. This will be mailed to your home with instructions on how to apply the monitor and how to return it when finished. Please allow 2 weeks after returning the heart monitor before our office calls you with the results.    Follow-Up: At [facility], you and your health needs are our priority.  As part of our continuing mission to provide you with exceptional heart care, our providers are all part of one team.  This team includes your primary Cardiologist (physician) and Advanced Practice Providers or APPs (Physician Assistants and Nurse Practitioners) who all work together to provide you with the care you need, when you need it.  Your next appointment:   3  month(s)  Provider:   Emeline FORBES Calender, MD    We recommend signing up for the patient portal called MyChart.  Sign up information is provided on this After Visit Summary.  MyChart is used to connect with patients for Virtual Visits (Telemedicine).  Patients are able to view lab/test results, encounter notes, upcoming appointments, etc.  Non-urgent messages can be sent to your provider as well.   To learn more about what you can do with MyChart, go to [email or web address].   Other Instructions ZIO XT- Long Term Monitor Instructions Your physician has requested you wear a ZIO patch monitor for 3 days.  This is a single patch monitor. [employer] supplies one patch monitor per enrollment. Additional stickers are not available. Please do not apply patch if you will be having a Nuclear Stress Test, Echocardiogram, Cardiac CT, MRI, or Chest Xray during the period you would be wearing the monitor. The patch cannot be worn during these tests. You cannot remove and re-apply the ZIO XT patch monitor.  Your ZIO patch monitor will be mailed 3 day USPS to your address on file. It may take 3-5 days to receive your monitor after you have been enrolled.  Once you have received your monitor, please review the enclosed instructions. Your monitor has already been registered assigning a specific monitor serial # to you.    Billing and Patient Assistance Program Information We have supplied [employer] with any of your insurance information on file for billing purposes. [employer] offers a sliding scale Patient Assistance Program for patients that do not have insurance, or whose insurance does not completely cover the cost of the ZIO monitor.  You must apply for the Patient Assistance Program to qualify for this discounted rate.  To apply, please call [employer] at [phone], select option 4, select option 2, ask to apply for Patient Assistance Program. Meredeth will ask your household income, and how many  people are in your household. They will quote your out-of-pocket cost based on that information. [employer] will also be able to set up a 63-month, interest-free payment plan if needed.    Applying the monitor  Shave hair from upper left chest.  Hold abrader disc by orange tab. Rub abrader in 40 strokes over the upper left chest as  indicated in your monitor instructions.  Clean area with 4 enclosed alcohol  pads. Let dry.  Apply patch as indicated in monitor instructions. Patch will be placed under collarbone on left  side of chest with arrow pointing upward.  Rub patch adhesive wings for 2 minutes. Remove white label marked 1. Remove the white  label marked 2. Rub patch adhesive wings for 2 additional minutes.  While looking in a mirror, press and release button in center of patch. A small green light will  flash 3-4 times. This will be your only indicator that the monitor has been turned on.  Do not shower for the first 24 hours. You may shower after the first 24 hours.  Press the button if you feel a symptom. You will hear a small click. Record Date, Time and  Symptom in the Patient Logbook.  When you are ready to remove the patch, follow instructions on the last 2 pages of Patient Logbook. Stick patch monitor onto the last page of Patient Logbook.  Place Patient Logbook in the blue and white box. Use locking tab on box and tape box closed securely. The blue and white box has prepaid postage on it. Please place it in the mailbox as soon as possible. Your physician should have your test results approximately 7 days after the monitor has been mailed back to [facility].  Call [facility] Customer Care at [phone] if you have questions regarding your ZIO XT patch monitor. Call them immediately if you see an orange light blinking on your monitor.  If your monitor falls off in less than 4 days, contact our Monitor department at [phone].  If your monitor  becomes loose or falls off after 4 days call [employer] at [phone] for suggestions on securing your monitor.

## 2024-03-06 NOTE — Progress Notes (Unsigned)
 Enrolled for [employer] to mail a ZIO XT long term holter monitor to the patients address on file.

## 2024-03-18 ENCOUNTER — Ambulatory Visit: Admitting: Neurology

## 2024-03-18 ENCOUNTER — Ambulatory Visit: Admitting: Pulmonary Disease

## 2024-03-27 DIAGNOSIS — R002 Palpitations: Secondary | ICD-10-CM

## 2024-03-28 ENCOUNTER — Ambulatory Visit: Payer: Self-pay | Admitting: Internal Medicine

## 2024-03-29 ENCOUNTER — Ambulatory Visit: Payer: Self-pay | Admitting: Pulmonary Disease

## 2024-04-12 ENCOUNTER — Ambulatory Visit (HOSPITAL_COMMUNITY)
Admission: RE | Admit: 2024-04-12 | Discharge: 2024-04-12 | Disposition: A | Discharge status: Home / Self Care | Source: Ambulatory Visit | Attending: Internal Medicine | Admitting: Internal Medicine

## 2024-04-12 DIAGNOSIS — R0602 Shortness of breath: Secondary | ICD-10-CM

## 2024-04-12 DIAGNOSIS — I10 Essential (primary) hypertension: Secondary | ICD-10-CM | POA: Diagnosis not present

## 2024-04-12 DIAGNOSIS — R002 Palpitations: Secondary | ICD-10-CM | POA: Insufficient documentation

## 2024-04-12 LAB — ECHOCARDIOGRAM COMPLETE
Area-P 1/2: 3.64 cm2
S' Lateral: 1.6 cm

## 2024-05-28 ENCOUNTER — Encounter: Payer: Self-pay | Admitting: Emergency Medicine

## 2024-06-10 ENCOUNTER — Ambulatory Visit: Admitting: Internal Medicine

## 2024-06-20 ENCOUNTER — Ambulatory Visit: Admitting: Neurology
